# Patient Record
Sex: Female | Born: 1980 | Race: Black or African American | Hispanic: No | Marital: Married | Smoking: Never smoker
Health system: Southern US, Community
[De-identification: ages and names within clinical notes are randomized; demographics above are authoritative.]

## PROBLEM LIST (undated history)

## (undated) DIAGNOSIS — C801 Malignant (primary) neoplasm, unspecified: Secondary | ICD-10-CM

## (undated) DIAGNOSIS — M1990 Unspecified osteoarthritis, unspecified site: Secondary | ICD-10-CM

## (undated) HISTORY — PX: TUMOR EXCISION: SHX421

---

## 2011-04-15 DIAGNOSIS — B372 Candidiasis of skin and nail: Secondary | ICD-10-CM | POA: Insufficient documentation

## 2011-04-15 DIAGNOSIS — E669 Obesity, unspecified: Secondary | ICD-10-CM | POA: Insufficient documentation

## 2011-04-15 DIAGNOSIS — Z789 Other specified health status: Secondary | ICD-10-CM | POA: Insufficient documentation

## 2011-05-17 DIAGNOSIS — IMO0001 Reserved for inherently not codable concepts without codable children: Secondary | ICD-10-CM | POA: Insufficient documentation

## 2011-05-20 DIAGNOSIS — E559 Vitamin D deficiency, unspecified: Secondary | ICD-10-CM | POA: Insufficient documentation

## 2011-05-20 DIAGNOSIS — E039 Hypothyroidism, unspecified: Secondary | ICD-10-CM | POA: Insufficient documentation

## 2011-07-07 DIAGNOSIS — E282 Polycystic ovarian syndrome: Secondary | ICD-10-CM | POA: Insufficient documentation

## 2011-10-06 DIAGNOSIS — C801 Malignant (primary) neoplasm, unspecified: Secondary | ICD-10-CM | POA: Insufficient documentation

## 2011-10-27 DIAGNOSIS — R32 Unspecified urinary incontinence: Secondary | ICD-10-CM | POA: Insufficient documentation

## 2013-05-16 DIAGNOSIS — L910 Hypertrophic scar: Secondary | ICD-10-CM | POA: Insufficient documentation

## 2015-03-16 DIAGNOSIS — M542 Cervicalgia: Secondary | ICD-10-CM | POA: Insufficient documentation

## 2015-03-16 DIAGNOSIS — M545 Low back pain, unspecified: Secondary | ICD-10-CM | POA: Insufficient documentation

## 2015-03-16 DIAGNOSIS — M79603 Pain in arm, unspecified: Secondary | ICD-10-CM | POA: Insufficient documentation

## 2015-05-01 DIAGNOSIS — N979 Female infertility, unspecified: Secondary | ICD-10-CM | POA: Insufficient documentation

## 2016-01-05 DIAGNOSIS — M47816 Spondylosis without myelopathy or radiculopathy, lumbar region: Secondary | ICD-10-CM | POA: Insufficient documentation

## 2016-01-05 DIAGNOSIS — G8929 Other chronic pain: Secondary | ICD-10-CM | POA: Insufficient documentation

## 2016-01-05 DIAGNOSIS — M25561 Pain in right knee: Secondary | ICD-10-CM | POA: Insufficient documentation

## 2016-06-15 DIAGNOSIS — M51369 Other intervertebral disc degeneration, lumbar region without mention of lumbar back pain or lower extremity pain: Secondary | ICD-10-CM | POA: Insufficient documentation

## 2016-06-15 DIAGNOSIS — M5136 Other intervertebral disc degeneration, lumbar region: Secondary | ICD-10-CM | POA: Insufficient documentation

## 2017-04-23 DIAGNOSIS — N970 Female infertility associated with anovulation: Secondary | ICD-10-CM | POA: Insufficient documentation

## 2017-05-12 ENCOUNTER — Other Ambulatory Visit: Payer: Self-pay | Admitting: Obstetrics and Gynecology

## 2017-05-12 DIAGNOSIS — Z3149 Encounter for other procreative investigation and testing: Secondary | ICD-10-CM

## 2017-11-03 ENCOUNTER — Emergency Department
Admission: EM | Admit: 2017-11-03 | Discharge: 2017-11-04 | Disposition: A | Discharge status: Home / Self Care | Payer: BC Managed Care – PPO | Attending: Emergency Medicine | Admitting: Emergency Medicine

## 2017-11-03 DIAGNOSIS — Z859 Personal history of malignant neoplasm, unspecified: Secondary | ICD-10-CM | POA: Insufficient documentation

## 2017-11-03 DIAGNOSIS — T783XXA Angioneurotic edema, initial encounter: Secondary | ICD-10-CM

## 2017-11-03 DIAGNOSIS — Z91010 Allergy to peanuts: Secondary | ICD-10-CM | POA: Diagnosis not present

## 2017-11-03 DIAGNOSIS — R220 Localized swelling, mass and lump, head: Secondary | ICD-10-CM | POA: Diagnosis present

## 2017-11-03 HISTORY — DX: Unspecified osteoarthritis, unspecified site: M19.90

## 2017-11-03 HISTORY — DX: Malignant (primary) neoplasm, unspecified: C80.1

## 2017-11-03 MED ORDER — FAMOTIDINE IN NACL 20-0.9 MG/50ML-% IV SOLN
20.0000 mg | Freq: Once | INTRAVENOUS | Status: AC
  Administered 2017-11-03: 20 mg via INTRAVENOUS
  Filled 2017-11-03: qty 50

## 2017-11-03 MED ORDER — METHYLPREDNISOLONE SODIUM SUCC 125 MG IJ SOLR
125.0000 mg | Freq: Once | INTRAMUSCULAR | Status: AC
  Administered 2017-11-03: 125 mg via INTRAVENOUS
  Filled 2017-11-03: qty 2

## 2017-11-03 MED ORDER — DIPHENHYDRAMINE HCL 50 MG/ML IJ SOLN
25.0000 mg | Freq: Once | INTRAMUSCULAR | Status: AC
Start: 2017-11-03 — End: 2017-11-03
  Administered 2017-11-03: 25 mg via INTRAVENOUS
  Filled 2017-11-03: qty 1

## 2017-11-03 NOTE — ED Notes (Signed)
Patient reports she took 1 benadryl approximately 2045.

## 2017-11-03 NOTE — ED Notes (Signed)
Pt talking in full and complete sentences with no difficulty at this time.

## 2017-11-03 NOTE — ED Notes (Signed)
Coffee provided to pt's spouse.

## 2017-11-03 NOTE — ED Triage Notes (Signed)
Patient c/o oral swelling beginning yesterday. Patient reports that lip swelling began yesterday, tongue and throat swelling began today.   Patient speaking in clear sentences, no distress noted. Patient able to maintain control of saliva.  Patient denies exposure to any known allergen.

## 2017-11-04 MED ORDER — FAMOTIDINE 40 MG PO TABS: 40.0000 mg | ORAL_TABLET | Freq: Every evening | ORAL | 0 refills | Status: DC

## 2017-11-04 MED ORDER — DIPHENHYDRAMINE HCL 25 MG PO TABS: 25.0000 mg | ORAL_TABLET | Freq: Four times a day (QID) | ORAL | 0 refills | Status: DC | PRN

## 2017-11-04 MED ORDER — PREDNISONE 20 MG PO TABS: 60.0000 mg | ORAL_TABLET | Freq: Every day | ORAL | 0 refills | Status: DC

## 2017-11-04 MED ORDER — EPINEPHRINE 0.3 MG/0.3ML IJ SOAJ: 0.3000 mg | Freq: Once | INTRAMUSCULAR | 0 refills | Status: AC

## 2017-11-04 NOTE — ED Notes (Signed)
Pt resting in no acute distress. Pt states tongue feels improved.

## 2017-11-04 NOTE — Discharge Instructions (Addendum)
Please use your EpiPen should you develop any swelling in your tongue or throat or shortness of breath.  Please return with any worsening symptoms or any other concerns.

## 2017-11-04 NOTE — ED Provider Notes (Signed)
[facility] Emergency Department Provider Note   ____________________________________________   First MD Initiated Contact with Patient 11/03/17 2310     (approximate)  I have reviewed the triage vital signs and the nursing notes.   HISTORY  Chief Complaint Oral Swelling    HPI Stephanie Lane is a 37 y.o. female who comes into the hospital today with swelling to her lips and tongue and throat.  She states that she noticed some swelling to her lips yesterday when she woke up from a nap.  She did not think much of it although she did have some bruises in the middle of her lip.  She reports that this morning when she woke up it seemed worse.  She took a Benadryl.  Tonight when she was eating dinner the tongue and her back of her throat felt tingly.  She developed some difficulty swallowing.  She took another Benadryl and decided to come in and get checked out.  She denies any shortness of breath, nausea, vomiting, new foods.  The patient has had some veggie burgers and veggie hotdogs.  She had a smoothie this morning with almonds but nothing else that would have potentially caused her reaction.  The patient is here today for evaluation of her symptoms.   Past Medical History:  Diagnosis Date  . Arthritis   . Cancer ([place])     There are no active problems to display for this patient.   Past Surgical History:  Procedure Laterality Date  . TUMOR EXCISION      Prior to Admission medications   Medication Sig Start Date End Date Taking? Authorizing Provider  hydrocortisone valerate cream (WESTCORT) 0.2 % Apply 1 application topically 2 (two) times daily as needed (itching).  01/23/17  Yes [provider]  levothyroxine (SYNTHROID, LEVOTHROID) 100 MCG tablet Take 100 mcg by mouth daily before breakfast. 10/13/17  Yes [provider]  triamcinolone cream (KENALOG) 0.1 % Apply 1 application topically 2 (two) times daily as needed for rash. for  legs, not for face, breasts or skin folds, not more than 2 weeks in a month 10/03/17  Yes [provider]  diphenhydrAMINE (BENADRYL ALLERGY) 25 MG tablet Take 1 tablet (25 mg total) by mouth every 6 (six) hours as needed. 11/04/17   Loney Hering, MD  EPINEPHrine (EPIPEN 2-PAK) 0.3 mg/0.3 mL IJ SOAJ injection Inject 0.3 mLs (0.3 mg total) into the muscle once for 1 dose. 11/04/17 11/04/17  Loney Hering, MD  famotidine (PEPCID) 40 MG tablet Take 1 tablet (40 mg total) by mouth every evening. 11/04/17 11/04/18  Loney Hering, MD  predniSONE (DELTASONE) 20 MG tablet Take 3 tablets (60 mg total) by mouth daily. 11/04/17   Loney Hering, MD    Allergies Peanut-containing drug products  No family history on file.  Social History Social History   Tobacco Use  . Smoking status: Never Smoker  . Smokeless tobacco: Never Used  Substance Use Topics  . Alcohol use: Not Currently  . Drug use: Not on file    Review of Systems  Constitutional: No fever/chills Eyes: No visual changes. ENT: Lip, tongue and throat swelling and tingling. Cardiovascular: Denies chest pain. Respiratory: Denies shortness of breath. Gastrointestinal: No abdominal pain.   Genitourinary: Negative for dysuria. Musculoskeletal: Negative for back pain. Skin: Negative for rash. Neurological: Negative for headaches   ____________________________________________   PHYSICAL EXAM:  VITAL SIGNS: ED Triage Vitals  Enc Vitals Group     BP  11/03/17 2110 120/72     Pulse Rate 11/03/17 2110 80     Resp 11/03/17 2110 18     Temp 11/03/17 2110 98.7 F (37.1 C)     Temp Source 11/03/17 2110 Oral     SpO2 11/03/17 2110 100 %     Weight 11/03/17 2111 290 lb (131.5 kg)     Height 11/03/17 2111 5\' 1"  (1.549 m)     Head Circumference --      Peak Flow --      Pain Score 11/03/17 2111 9     Pain Loc --      Pain Edu? --      Excl. in [place]? --     Constitutional: Alert and oriented. Well appearing  and in mild distress. Eyes: Conjunctivae are normal. PERRL. EOMI. Head: Atraumatic. Nose: No congestion/rhinnorhea. Mouth/Throat: Mucous membranes are moist.  Oropharynx non-erythematous.  There is some mild swelling to the patient's lips and some mild swelling to her tongue.  She is not drooling and she is tolerating her secretions well. Cardiovascular: Normal rate, regular rhythm. Grossly normal heart sounds.  Good peripheral circulation. Respiratory: Normal respiratory effort.  No retractions. Lungs CTAB. Gastrointestinal: Soft and nontender. No distention.  Positive bowel sounds Musculoskeletal: No lower extremity tenderness nor edema. . Neurologic:  Normal speech and language.  Skin:  Skin is warm, dry and intact.  Psychiatric: Mood and affect are normal.   ____________________________________________   LABS (all labs ordered are listed, but only abnormal results are displayed)  Labs Reviewed - No data to display ____________________________________________  EKG  none ____________________________________________  RADIOLOGY  ED MD interpretation:  none  Official radiology report(s): No results found.  ____________________________________________   PROCEDURES  Procedure(s) performed: None  Procedures  Critical Care performed: No  ____________________________________________   INITIAL IMPRESSION / ASSESSMENT AND PLAN / ED COURSE  As part of my medical decision making, I reviewed the following data within the electronic MEDICAL RECORD NUMBER Notes from prior ED visits and [place] Controlled Substance Database   This is a 37 year old female who comes into the hospital today with some lip swelling as well as tongue swelling and throat swelling.  The patient was concerned about allergic reaction.  She took 2 doses of Benadryl but came into the hospital.  The patient had some minimal swelling but when she arrived I did give her some Solu-Medrol as well as Pepcid and some more  Benadryl.  I did not give the patient an EpiPen as she did not have any drooling or any severe distress.  She also is not having any shortness of breath.  We monitored the patient for multiple hours in the emergency department.  She states that eventually her throat swelling and tingling as well as her tongue swelling improved.  The patient does had some numbness at the tip of her tongue.  The patient will be discharged home with some steroids.  She should follow-up with an allergist for further evaluation of her allergic symptoms.      ____________________________________________   FINAL CLINICAL IMPRESSION(S) / ED DIAGNOSES  Final diagnoses:  Angioedema, initial encounter     ED Discharge Orders        Ordered    predniSONE (DELTASONE) 20 MG tablet  Daily     11/04/17 0212    famotidine (PEPCID) 40 MG tablet  Every evening     11/04/17 0212    diphenhydrAMINE (BENADRYL ALLERGY) 25 MG tablet  Every 6 hours PRN  11/04/17 0212    EPINEPHrine (EPIPEN 2-PAK) 0.3 mg/0.3 mL IJ SOAJ injection   Once     11/04/17 8006       Note:  This document was prepared using Dragon voice recognition software and may include unintentional dictation errors.    Loney Hering, MD 11/04/17 [id number]

## 2018-04-03 ENCOUNTER — Ambulatory Visit: Payer: BC Managed Care – PPO | Admitting: Internal Medicine

## 2018-05-01 ENCOUNTER — Other Ambulatory Visit: Payer: Self-pay | Admitting: Obstetrics and Gynecology

## 2018-05-18 ENCOUNTER — Ambulatory Visit
Admission: RE | Admit: 2018-05-18 | Discharge: 2018-05-18 | Disposition: A | Discharge status: Home / Self Care | Payer: BC Managed Care – PPO | Source: Ambulatory Visit | Attending: Obstetrics and Gynecology | Admitting: Obstetrics and Gynecology

## 2018-05-18 DIAGNOSIS — Z3149 Encounter for other procreative investigation and testing: Secondary | ICD-10-CM

## 2018-05-18 DIAGNOSIS — N970 Female infertility associated with anovulation: Secondary | ICD-10-CM | POA: Diagnosis present

## 2018-05-18 MED ORDER — IOPAMIDOL (ISOVUE-370) INJECTION 76%
5.0000 mL | Freq: Once | INTRAVENOUS | Status: AC | PRN
  Administered 2018-05-18: 5 mL

## 2018-05-18 NOTE — Progress Notes (Signed)
Patient ID: Stephanie Lane, female   DOB: [birth year], 38 y.o.   MRN: [id number]  [id number] y.o. 05/18/18  Benjaman Kindler, MD  Hysterosalpingogram Procedure Note  Date of procedure: 05/18/2018   Pre-operative Diagnosis: Infertility  Post-operative Diagnosis: same, no tubal blockage  Procedure: Hysterosalpingogram  Surgeon: Angelina Pih, MD  Assistant(s):  Radiology assistant. The radiologist present for today read the imaging and agreed with findings below.  Anesthesia: None  Estimated Blood Loss:  None         Complications:  None; patient tolerated the procedure well.         Disposition: To home         Condition: stable  Findings: Bilateral fill and spill of the tubes and a normal endometrial contour was noted. Right tube was sluggish but spill did result.  Procedure Details  HSG procedure discussed with the patient.  Risks, complications, alternatives have been reviewed with her and she agrees to proceed.   The patient presented to the radiology lab and was identified as the correct patient and the procedure verified as an HSG. A verbal Time Out was held with all team members present and in agreement.  Speculum was inserted in to the vagina and the cervix visualized.  The cervix was cleaned with betadine solution. The HSG catheter was inserted and the balloon insufflated with approximately 1.5 ml of air.  Patient was then repositioned for fluoroscopy.  A total of 6 ml of contrast was used for the procedure. The patient tolerated the procedure well, no complications.   Bilateral fill and spill of the tubes and a normal endometrial contour was noted.  Results were reviewed with the patient at the time of the procedure. She verbalized understanding.   Benjaman Kindler, MD 05/18/2018

## 2020-03-23 ENCOUNTER — Encounter: Payer: Self-pay | Admitting: Neurology

## 2020-04-13 NOTE — Progress Notes (Signed)
Cardiology Office Note:    Date:  04/17/2020   ID:  Stephanie Lane, DOB [birth year], MRN [id number]  PCP:  Glendon Axe, MD  Cardiologist:  No primary care provider on file.  Electrophysiologist:  None   Referring MD: Ephriam Jenkins, PA   Chief Complaint  Patient presents with  . Palpitations    History of Present Illness:    Stephanie Lane is a 40 y.o. female with a hx of hypothyroidism who is referred by Benjiman Core, PA for evaluation of cardiovascular risk assessment.  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  Following this episode, developed numbness in her face that persisted for days.  She has an appointment coming up with neurology for evaluation.  Is also been having episodes of lightheadedness and palpitations since that time.  No syncopal episodes but has felt significant lightheadedness.  Having episode of palpitations were feels like her heart is racing.  Occurring multiple times per day, lasts for 3 to 5 minutes.  She denies any chest pain or shortness of breath.  She works out by doing 15 minutes of cardio per day and 30 minutes on the weekend.  She has lost over 100 pounds in the last 2 years.  No smoking history.  Family history includes mother had MI in 50s and maternal grandmother died of MI at age 60.   Past Medical History:  Diagnosis Date  . Arthritis   . Cancer [facility])     Past Surgical History:  Procedure Laterality Date  . TUMOR EXCISION      Current Medications: Current Meds  Medication Sig  . Chaste Tree (VITEX EXTRACT PO) Take by mouth.  Leone Haven, Turnera diffusa, (DAMIANA LEAF PO) Take by mouth.  . diphenhydrAMINE (BENADRYL ALLERGY) 25 MG tablet Take 1 tablet (25 mg total) by mouth every 6 (six) hours as needed.  Marland Kitchen EPINEPHrine (EPIPEN 2-PAK) 0.3 mg/0.3 mL IJ SOAJ injection Inject 0.3 mLs (0.3 mg total) into the muscle once for 1 dose.  . hydrocortisone valerate cream  (WESTCORT) 0.2 % Apply 1 application topically 2 (two) times daily as needed (itching).   Marland Kitchen levothyroxine (SYNTHROID, LEVOTHROID) 100 MCG tablet Take 100 mcg by mouth daily before breakfast.  . niacin 100 MG tablet Take 100 mg by mouth at bedtime.  Marland Kitchen PANTOTHENIC ACID PO Take by mouth.  . predniSONE (DELTASONE) 20 MG tablet Take 3 tablets (60 mg total) by mouth daily.  . Red Yeast Rice Extract (RED YEAST RICE PO) Take by mouth.  . triamcinolone cream (KENALOG) 0.1 % Apply 1 application topically 2 (two) times daily as needed for rash. for legs, not for face, breasts or skin folds, not more than 2 weeks in a month     Allergies:   Metformin, Other, Stevioside, Amoxicillin, Erythromycin base, Peanut-containing drug products, and Hydrocodone   Social History   Socioeconomic History  . Marital status: Married    Spouse name: Not on file  . Number of children: Not on file  . Years of education: Not on file  . Highest education level: Not on file  Occupational History  . Not on file  Tobacco Use  . Smoking status: Never Smoker  . Smokeless tobacco: Never Used  Substance and Sexual Activity  . Alcohol use: Not Currently  . Drug use: Not on file  . Sexual activity: Not on file  Other Topics Concern  . Not on file  Social History Narrative  .  Not on file   Social Determinants of Health   Financial Resource Strain: Not on file  Food Insecurity: Not on file  Transportation Needs: Not on file  Physical Activity: Not on file  Stress: Not on file  Social Connections: Not on file     Family History: The patient's family history is not on file.  ROS:   Please see the history of present illness.     All other systems reviewed and are negative.  EKGs/Labs/Other Studies Reviewed:    The following studies were reviewed today:   EKG:  EKG is  ordered today.  The ekg ordered today demonstrates normal sinus rhythm, rate 62, no ST abnormality  Recent Labs: No results found for  requested labs within last 8760 hours.  Recent Lipid Panel No results found for: CHOL, TRIG, HDL, CHOLHDL, VLDL, LDLCALC, LDLDIRECT  Physical Exam:    VS:  BP 124/78   Pulse 62   Ht 5\' 1"  (1.549 m)   Wt 194 lb 9.6 oz (88.3 kg)   BMI 36.77 kg/m     Wt Readings from Last 3 Encounters:  04/17/20 194 lb 9.6 oz (88.3 kg)  11/03/17 290 lb (131.5 kg)     GEN: Well nourished, well developed in no acute distress HEENT: Normal NECK: No JVD; No carotid bruits LYMPHATICS: No lymphadenopathy CARDIAC: RRR, no murmurs, rubs, gallops RESPIRATORY:  Clear to auscultation without rales, wheezing or rhonchi  ABDOMEN: Soft, non-tender, non-distended MUSCULOSKELETAL:  No edema; No deformity  SKIN: Warm and dry NEUROLOGIC:  Alert and oriented x 3 PSYCHIATRIC:  Normal affect   ASSESSMENT:    1. Palpitations   2. Family history of early CAD   3. Lightheadedness   4. Hyperlipidemia, unspecified hyperlipidemia type    PLAN:     Palpitations: Will check Zio patch x3 days to evaluate for arrhythmia.  If unremarkable, suspect episodes likely related to anxiety/panic attacks  Lightheadedness: Will check echocardiogram to rule out structural heart disease  Hyperlipidemia: LDL 167 on 11/28/2019.  Does not meet indication for statin at this time but given her family history, will check calcium score for further risk ratification  RTC in 3 months   Medication Adjustments/Labs and Tests Ordered: Current medicines are reviewed at length with the patient today.  Concerns regarding medicines are outlined above.  Orders Placed This Encounter  Procedures  . CT CARDIAC SCORING (SELF PAY ONLY)  . LONG TERM MONITOR (3-14 DAYS)  . EKG 12-Lead  . ECHOCARDIOGRAM COMPLETE   No orders of the defined types were placed in this encounter.   Patient Instructions  Medication Instructions:  Your physician recommends that you continue on your current medications as directed. Please refer to the Current  Medication list given to you today.  Testing/Procedures: Your physician has requested that you have an echocardiogram. Echocardiography is a painless test that uses sound waves to create images of your heart. It provides your doctor with information about the size and shape of your heart and how well your heart's chambers and valves are working. This procedure takes approximately one hour. There are no restrictions for this procedure.  This will be done at our [facility] location:  [address]  CT coronary calcium score. This test is done at [address]. This is $99 out of pocket.   Coronary CalciumScan A coronary calcium scan is an imaging test used to look for deposits of calcium and other fatty materials (plaques) in  the inner lining of the blood vessels of the heart (coronary arteries). These deposits of calcium and plaques can partly clog and narrow the coronary arteries without producing any symptoms or warning signs. This puts a person at risk for a heart attack. This test can detect these deposits before symptoms develop. Tell a health care provider about:  Any allergies you have.  All medicines you are taking, including vitamins, herbs, eye drops, creams, and over-the-counter medicines.  Any problems you or family members have had with anesthetic medicines.  Any blood disorders you have.  Any surgeries you have had.  Any medical conditions you have.  Whether you are pregnant or may be pregnant. What are the risks? Generally, this is a safe procedure. However, problems may occur, including:  Harm to a pregnant woman and her unborn baby. This test involves the use of radiation. Radiation exposure can be dangerous to a pregnant woman and her unborn baby. If you are pregnant, you generally should not have this procedure done.  Slight increase in the risk of cancer. This is because of the radiation involved in the test. What happens  before the procedure? No preparation is needed for this procedure. What happens during the procedure?  You will undress and remove any jewelry around your neck or chest.  You will put on a hospital gown.  Sticky electrodes will be placed on your chest. The electrodes will be connected to an electrocardiogram (ECG) machine to record a tracing of the electrical activity of your heart.  A CT scanner will take pictures of your heart. During this time, you will be asked to lie still and hold your breath for 2-3 seconds while a picture of your heart is being taken. The procedure may vary among health care providers and hospitals. What happens after the procedure?  You can get dressed.  You can return to your normal activities.  It is up to you to get the results of your test. Ask your health care provider, or the department that is doing the test, when your results will be ready. Summary  A coronary calcium scan is an imaging test used to look for deposits of calcium and other fatty materials (plaques) in the inner lining of the blood vessels of the heart (coronary arteries).  Generally, this is a safe procedure. Tell your health care provider if you are pregnant or may be pregnant.  No preparation is needed for this procedure.  A CT scanner will take pictures of your heart.  You can return to your normal activities after the scan is done. This information is not intended to replace advice given to you by your health care provider. Make sure you discuss any questions you have with your health care provider. Document Released: 09/24/2007 Document Revised: 02/15/2016 Document Reviewed: 02/15/2016 Elsevier Interactive Patient Education  2017 [employer] Monitor Instructions   Your physician has requested you wear your ZIO patch monitor 3 days.   This is a single patch monitor.  [employer] supplies one patch monitor per enrollment.  Additional stickers are not  available.   Please do not apply patch if you will be having a Nuclear Stress Test, Echocardiogram, Cardiac CT, MRI, or Chest Xray during the time frame you would be wearing the monitor. The patch cannot be worn during these tests.  You cannot remove and re-apply the ZIO XT patch monitor.   Your ZIO patch monitor will be sent USPS Priority mail from  [employer] directly to your home address. The monitor may also be mailed to a PO BOX if home delivery is not available.   It may take 3-5 days to receive your monitor after you have been enrolled.   Once you have received you monitor, please review enclosed instructions.  Your monitor has already been registered assigning a specific monitor serial # to you.   Applying the monitor   Shave hair from upper left chest.   Hold abrader disc by orange tab.  Rub abrader in 40 strokes over left upper chest as indicated in your monitor instructions.   Clean area with 4 enclosed alcohol pads .  Use all pads to assure are is cleaned thoroughly.  Let dry.   Apply patch as indicated in monitor instructions.  Patch will be place under collarbone on left side of chest with arrow pointing upward.   Rub patch adhesive wings for 2 minutes.Remove white label marked "1".  Remove white label marked "2".  Rub patch adhesive wings for 2 additional minutes.   While looking in a mirror, press and release button in center of patch.  A small green light will flash 3-4 times .  This will be your only indicator the monitor has been turned on.     Do not shower for the first 24 hours.  You may shower after the first 24 hours.   Press button if you feel a symptom. You will hear a small click.  Record Date, Time and Symptom in the Patient Log Book.   When you are ready to remove patch, follow instructions on last 2 pages of Patient Log Book.  Stick patch monitor onto last page of Patient Log Book.   Place Patient Log Book in [place] box.  Use locking tab on box and  tape box closed securely.  The [employer] and [employer] has [employer] on it.  Please place in mailbox as soon as possible.  Your physician should have your test results approximately 7 days after the monitor has been mailed back to [facility].   Call [facility] Customer Care at [phone] if you have questions regarding your ZIO XT patch monitor.  Call them immediately if you see an orange light blinking on your monitor.   If your monitor falls off in less than 4 days contact our Monitor department at [phone].  If your monitor becomes loose or falls off after 4 days call [employer] at [phone] for suggestions on securing your monitor.    Follow-Up: At [facility], you and your health needs are our priority.  As part of our continuing mission to provide you with exceptional heart care, we have created designated Provider Care Teams.  These Care Teams include your primary Cardiologist (physician) and Advanced Practice Providers (APPs -  Physician Assistants and Nurse Practitioners) who all work together to provide you with the care you need, when you need it.  We recommend signing up for the patient portal called "MyChart".  Sign up information is provided on this After Visit Summary.  MyChart is used to connect with patients for Virtual Visits (Telemedicine).  Patients are able to view lab/test results, encounter notes, upcoming appointments, etc.  Non-urgent messages can be sent to your provider as well.   To learn more about what you can do with MyChart, go to [email or web address].    Your next appointment:   3 month(s)  The format for your next appointment:   In Person  Provider:   Cristal Deer  Gardiner Rhyme, MD         Signed, Donato Heinz, MD  04/17/2020 6:09 PM    [place]

## 2020-04-17 ENCOUNTER — Other Ambulatory Visit: Payer: Self-pay

## 2020-04-17 ENCOUNTER — Encounter: Payer: Self-pay | Admitting: Cardiology

## 2020-04-17 ENCOUNTER — Encounter: Payer: Self-pay | Admitting: Radiology

## 2020-04-17 ENCOUNTER — Ambulatory Visit (INDEPENDENT_AMBULATORY_CARE_PROVIDER_SITE_OTHER): Payer: BC Managed Care – PPO

## 2020-04-17 ENCOUNTER — Ambulatory Visit (INDEPENDENT_AMBULATORY_CARE_PROVIDER_SITE_OTHER): Payer: Self-pay | Admitting: Cardiology

## 2020-04-17 VITALS — BP 124/78 | HR 62 | Ht 61.0 in | Wt 194.6 lb

## 2020-04-17 DIAGNOSIS — R42 Dizziness and giddiness: Secondary | ICD-10-CM

## 2020-04-17 DIAGNOSIS — Z8249 Family history of ischemic heart disease and other diseases of the circulatory system: Secondary | ICD-10-CM

## 2020-04-17 DIAGNOSIS — R002 Palpitations: Secondary | ICD-10-CM

## 2020-04-17 DIAGNOSIS — E785 Hyperlipidemia, unspecified: Secondary | ICD-10-CM

## 2020-04-17 NOTE — Patient Instructions (Signed)
Medication Instructions:  Your physician recommends that you continue on your current medications as directed. Please refer to the Current Medication list given to you today.  Testing/Procedures: Your physician has requested that you have an echocardiogram. Echocardiography is a painless test that uses sound waves to create images of your heart. It provides your doctor with information about the size and shape of your heart and how well your heart's chambers and valves are working. This procedure takes approximately one hour. There are no restrictions for this procedure.  This will be done at our [facility] location:  [address]  CT coronary calcium score. This test is done at [address]. This is $99 out of pocket.   Coronary CalciumScan A coronary calcium scan is an imaging test used to look for deposits of calcium and other fatty materials (plaques) in the inner lining of the blood vessels of the heart (coronary arteries). These deposits of calcium and plaques can partly clog and narrow the coronary arteries without producing any symptoms or warning signs. This puts a person at risk for a heart attack. This test can detect these deposits before symptoms develop. Tell a health care provider about:  Any allergies you have.  All medicines you are taking, including vitamins, herbs, eye drops, creams, and over-the-counter medicines.  Any problems you or family members have had with anesthetic medicines.  Any blood disorders you have.  Any surgeries you have had.  Any medical conditions you have.  Whether you are pregnant or may be pregnant. What are the risks? Generally, this is a safe procedure. However, problems may occur, including:  Harm to a pregnant woman and her unborn baby. This test involves the use of radiation. Radiation exposure can be dangerous to a pregnant woman and her unborn baby. If you are pregnant, you generally should not  have this procedure done.  Slight increase in the risk of cancer. This is because of the radiation involved in the test. What happens before the procedure? No preparation is needed for this procedure. What happens during the procedure?  You will undress and remove any jewelry around your neck or chest.  You will put on a hospital gown.  Sticky electrodes will be placed on your chest. The electrodes will be connected to an electrocardiogram (ECG) machine to record a tracing of the electrical activity of your heart.  A CT scanner will take pictures of your heart. During this time, you will be asked to lie still and hold your breath for 2-3 seconds while a picture of your heart is being taken. The procedure may vary among health care providers and hospitals. What happens after the procedure?  You can get dressed.  You can return to your normal activities.  It is up to you to get the results of your test. Ask your health care provider, or the department that is doing the test, when your results will be ready. Summary  A coronary calcium scan is an imaging test used to look for deposits of calcium and other fatty materials (plaques) in the inner lining of the blood vessels of the heart (coronary arteries).  Generally, this is a safe procedure. Tell your health care provider if you are pregnant or may be pregnant.  No preparation is needed for this procedure.  A CT scanner will take pictures of your heart.  You can return to your normal activities after the scan is done. This information is not  intended to replace advice given to you by your health care provider. Make sure you discuss any questions you have with your health care provider. Document Released: 09/24/2007 Document Revised: 02/15/2016 Document Reviewed: 02/15/2016 Elsevier Interactive Patient Education  2017 [place] Term Monitor Instructions   Your physician has requested you wear your ZIO patch  monitor 3 days.   This is a single patch monitor.  [employer] supplies one patch monitor per enrollment.  Additional stickers are not available.   Please do not apply patch if you will be having a Nuclear Stress Test, Echocardiogram, Cardiac CT, MRI, or Chest Xray during the time frame you would be wearing the monitor. The patch cannot be worn during these tests.  You cannot remove and re-apply the ZIO XT patch monitor.   Your ZIO patch monitor will be sent USPS Priority mail from [facility] directly to your home address. The monitor may also be mailed to a PO BOX if home delivery is not available.   It may take 3-5 days to receive your monitor after you have been enrolled.   Once you have received you monitor, please review enclosed instructions.  Your monitor has already been registered assigning a specific monitor serial # to you.   Applying the monitor   Shave hair from upper left chest.   Hold abrader disc by orange tab.  Rub abrader in 40 strokes over left upper chest as indicated in your monitor instructions.   Clean area with 4 enclosed alcohol pads .  Use all pads to assure are is cleaned thoroughly.  Let dry.   Apply patch as indicated in monitor instructions.  Patch will be place under collarbone on left side of chest with arrow pointing upward.   Rub patch adhesive wings for 2 minutes.Remove white label marked "1".  Remove white label marked "2".  Rub patch adhesive wings for 2 additional minutes.   While looking in a mirror, press and release button in center of patch.  A small green light will flash 3-4 times .  This will be your only indicator the monitor has been turned on.     Do not shower for the first 24 hours.  You may shower after the first 24 hours.   Press button if you feel a symptom. You will hear a small click.  Record Date, Time and Symptom in the Patient Log Book.   When you are ready to remove patch, follow instructions on last 2 pages of Patient Log  Book.  Stick patch monitor onto last page of Patient Log Book.   Place Patient Log Book in [place] box.  Use locking tab on box and tape box closed securely.  The [employer] has [employer] on it.  Please place in mailbox as soon as possible.  Your physician should have your test results approximately 7 days after the monitor has been mailed back to [facility].   Call [place] at [phone] if you have questions regarding your ZIO XT patch monitor.  Call them immediately if you see an orange light blinking on your monitor.   If your monitor falls off in less than 4 days contact our Monitor department at [phone].  If your monitor becomes loose or falls off after 4 days call [employer] at [phone] for suggestions on securing your monitor.    Follow-Up: At [facility], you and your health needs are our priority.  As part of  our continuing mission to provide you with exceptional heart care, we have created designated Provider Care Teams.  These Care Teams include your primary Cardiologist (physician) and Advanced Practice Providers (APPs -  Physician Assistants and Nurse Practitioners) who all work together to provide you with the care you need, when you need it.  We recommend signing up for the patient portal called "MyChart".  Sign up information is provided on this After Visit Summary.  MyChart is used to connect with patients for Virtual Visits (Telemedicine).  Patients are able to view lab/test results, encounter notes, upcoming appointments, etc.  Non-urgent messages can be sent to your provider as well.   To learn more about what you can do with MyChart, go to [email or web address].    Your next appointment:   3 month(s)  The format for your next appointment:   In Person  Provider:   Oswaldo Milian, MD

## 2020-04-17 NOTE — Progress Notes (Signed)
Enrolled patient for a 3 day Zio XT Monitor to be mailed to patients home.  °

## 2020-04-24 ENCOUNTER — Ambulatory Visit (INDEPENDENT_AMBULATORY_CARE_PROVIDER_SITE_OTHER)
Admission: RE | Admit: 2020-04-24 | Discharge: 2020-04-24 | Disposition: A | Discharge status: Home / Self Care | Payer: Self-pay | Source: Ambulatory Visit | Attending: Cardiology | Admitting: Cardiology

## 2020-04-24 ENCOUNTER — Other Ambulatory Visit: Payer: Self-pay

## 2020-04-24 DIAGNOSIS — Z8249 Family history of ischemic heart disease and other diseases of the circulatory system: Secondary | ICD-10-CM

## 2020-05-06 DIAGNOSIS — R002 Palpitations: Secondary | ICD-10-CM

## 2020-05-12 ENCOUNTER — Other Ambulatory Visit: Payer: Self-pay

## 2020-05-12 ENCOUNTER — Ambulatory Visit (HOSPITAL_COMMUNITY): Payer: BC Managed Care – PPO | Attending: Internal Medicine

## 2020-05-12 DIAGNOSIS — R002 Palpitations: Secondary | ICD-10-CM

## 2020-05-12 LAB — ECHOCARDIOGRAM COMPLETE
Area-P 1/2: 2.87 cm2
S' Lateral: 2.8 cm

## 2020-05-21 ENCOUNTER — Encounter: Payer: Self-pay | Admitting: Neurology

## 2020-05-21 ENCOUNTER — Ambulatory Visit: Payer: BC Managed Care – PPO | Admitting: Neurology

## 2020-05-21 ENCOUNTER — Other Ambulatory Visit: Payer: Self-pay

## 2020-05-21 VITALS — BP 118/79 | HR 109 | Resp 18 | Ht 61.0 in | Wt 188.0 lb

## 2020-05-21 DIAGNOSIS — R202 Paresthesia of skin: Secondary | ICD-10-CM

## 2020-05-21 DIAGNOSIS — R200 Anesthesia of skin: Secondary | ICD-10-CM

## 2020-05-21 DIAGNOSIS — R251 Tremor, unspecified: Secondary | ICD-10-CM | POA: Diagnosis not present

## 2020-05-21 DIAGNOSIS — R2681 Unsteadiness on feet: Secondary | ICD-10-CM

## 2020-05-21 DIAGNOSIS — R42 Dizziness and giddiness: Secondary | ICD-10-CM

## 2020-05-21 DIAGNOSIS — R519 Headache, unspecified: Secondary | ICD-10-CM | POA: Diagnosis not present

## 2020-05-21 NOTE — Progress Notes (Signed)
NEUROLOGY CONSULTATION NOTE  Stephanie Lane MRN: [id number] DOB: [birth year]  Referring provider: Benjiman Core, PA Primary care provider: Maude Leriche, PA-C  Reason for consult:  dizziness   Subjective:  Stephanie Lane is a 40 year old right-handed female with hypothyroidism who presents for dizziness. History supplemented by referring provider's note.  On 02/25/2020 she was at work and received some distressing news.  She then developed a severe headache with palpitations, out of body sensation and left arm weakness.  She developed clenching of the of the left side of her face that lasted a week.  She has PTSD and this event aggravated her symptoms.  She then developed nightly panic attacks and elevated blood pressure.  After this event, she started experiencing nightly panic attacks and elevated blood pressure, hyperventilation; she felt nauseous for 3 weeks.  She was unable to process information and concentrate for 6 weeks.  She would have throbbing left occipital headaches off and on.    She noted diffuse weakness for 6 weeks.  Loss of appetite for 3 1/2 weeks.  Tremor with numbness in the index finger and thumb of her right hand.  No neck pain.  She feels off-balance when she walks but no falls.  She has been out of work due to these symptoms.  She has history of brief vertigo lasting a few seconds.  For palpitations, she was evaluated by cardiology.  Holter monitor normal.  Echocardiogram was normal except for trivial MVR.  Overall, symptoms have steadily been improving.    03/21/2020 LABS:  CBC with WBC 7.9, HGB 12.8, HCT 38.5, PLT 249; B12 891;   PAST MEDICAL HISTORY: Past Medical History:  Diagnosis Date  . Arthritis   . Cancer [facility])     PAST SURGICAL HISTORY: Past Surgical History:  Procedure Laterality Date  . TUMOR EXCISION      MEDICATIONS: Current Outpatient Medications on File Prior to Visit  Medication Sig Dispense Refill  . [place] Tree (VITEX EXTRACT  PO) Take by mouth.    Leone Haven, Turnera diffusa, (DAMIANA LEAF PO) Take by mouth.    . diphenhydrAMINE (BENADRYL ALLERGY) 25 MG tablet Take 1 tablet (25 mg total) by mouth every 6 (six) hours as needed. 30 tablet 0  . EPINEPHrine (EPIPEN 2-PAK) 0.3 mg/0.3 mL IJ SOAJ injection Inject 0.3 mLs (0.3 mg total) into the muscle once for 1 dose. 1 Device 0  . famotidine (PEPCID) 40 MG tablet Take 1 tablet (40 mg total) by mouth every evening. 7 tablet 0  . hydrocortisone valerate cream (WESTCORT) 0.2 % Apply 1 application topically 2 (two) times daily as needed (itching).     Marland Kitchen levothyroxine (SYNTHROID, LEVOTHROID) 100 MCG tablet Take 100 mcg by mouth daily before breakfast.  4  . niacin 100 MG tablet Take 100 mg by mouth at bedtime.    Marland Kitchen PANTOTHENIC ACID PO Take by mouth.    . predniSONE (DELTASONE) 20 MG tablet Take 3 tablets (60 mg total) by mouth daily. 12 tablet 0  . Red Yeast Rice Extract (RED YEAST RICE PO) Take by mouth.    . triamcinolone cream (KENALOG) 0.1 % Apply 1 application topically 2 (two) times daily as needed for rash. for legs, not for face, breasts or skin folds, not more than 2 weeks in a month  1   No current facility-administered medications on file prior to visit.    ALLERGIES: Allergies  Allergen Reactions  . Metformin Other (See Comments) and Diarrhea  . Other Swelling  and Itching  . Stevioside Dermatitis, Hives, Itching and Swelling  . Amoxicillin   . Erythromycin Base   . Peanut-Containing Drug Products Swelling  . Hydrocodone Other (See Comments) and Nausea Only    FAMILY HISTORY: No family history on file.  SOCIAL HISTORY: Social History   Socioeconomic History  . Marital status: Married    Spouse name: Not on file  . Number of children: Not on file  . Years of education: Not on file  . Highest education level: Not on file  Occupational History  . Not on file  Tobacco Use  . Smoking status: Never Smoker  . Smokeless tobacco: Never Used  Substance  and Sexual Activity  . Alcohol use: Not Currently  . Drug use: Not on file  . Sexual activity: Not on file  Other Topics Concern  . Not on file  Social History Narrative  . Not on file   Social Determinants of Health   Financial Resource Strain: Not on file  Food Insecurity: Not on file  Transportation Needs: Not on file  Physical Activity: Not on file  Stress: Not on file  Social Connections: Not on file  Intimate Partner Violence: Not on file    Objective:  Blood pressure 118/79, pulse (!) 109, resp. rate 18, height 5\' 1"  (1.549 m), weight 188 lb (85.3 kg), SpO2 100 %. General: No acute distress.  Patient appears well-groomed.   Head:  Normocephalic/atraumatic Eyes:  fundi examined but not visualized Neck: supple, no paraspinal tenderness, full range of motion Back: No paraspinal tenderness Heart: regular rate and rhythm Lungs: Clear to auscultation bilaterally. Vascular: No carotid bruits. Neurological Exam: Mental status: alert and oriented to person, place, and time, recent and remote memory intact, fund of knowledge intact, attention and concentration intact, speech fluent and not dysarthric, language intact. Cranial nerves: CN I: not tested CN II: pupils equal, round and reactive to light, visual fields intact CN III, IV, VI:  full range of motion, no nystagmus, no ptosis CN V: facial sensation intact. CN VII: upper and lower face symmetric CN VIII: hearing intact CN IX, X: gag intact, uvula midline CN XI: sternocleidomastoid and trapezius muscles intact CN XII: tongue midline Bulk & Tone: normal, no fasciculations. Motor:  muscle strength 5/5 throughout Sensation:  Pinprick, temperature and vibratory sensation intact. Deep Tendon Reflexes:  2+ throughout,  toes downgoing.   Finger to nose testing:  Without dysmetria.   Heel to shin:  Without dysmetria.   Gait:  Normal station and stride.  Romberg negative.  Assessment/Plan:   Multiple symptomatology  including: Headache Tremor Numbness and tingling of right hand  Weakness Panic attacks Vertigo  Will check MRI of brain with and without contrast.  Further recommendations pending results.   Thank you for allowing me to take part in the care of this patient.  Metta Clines, DO

## 2020-05-21 NOTE — Patient Instructions (Addendum)
1.  Will check MRI of brain with and without contrast. We have sent a referral to [facility] for your MRI and they will call you directly to schedule your appointment. They are located at [place]. If you need to contact them directly please call [phone].   2.  Further recommendations pending results.

## 2020-05-28 ENCOUNTER — Other Ambulatory Visit: Payer: Self-pay | Admitting: Neurology

## 2020-05-28 ENCOUNTER — Telehealth: Payer: Self-pay | Admitting: Neurology

## 2020-05-28 MED ORDER — DIAZEPAM 5 MG PO TABS: ORAL_TABLET | ORAL | 0 refills | Status: DC

## 2020-05-28 NOTE — Telephone Encounter (Signed)
Patient would like to know if there is anything Dr Tomi Likens could prescribe or recommend to help her get through her MRI? She states the last MRI she tried to do she ended up getting a panic attack during the imaging. Please call.

## 2020-05-28 NOTE — Telephone Encounter (Signed)
Left a detailed message for pt, Please take 30-60 minutes before MRI. Please have a driver.

## 2020-05-28 NOTE — Telephone Encounter (Signed)
Sent script for diazepam 5mg .  Must have driver to and from the MRI facility due to drowsiness.

## 2020-06-01 ENCOUNTER — Ambulatory Visit: Payer: BC Managed Care – PPO | Admitting: Neurology

## 2020-06-13 ENCOUNTER — Other Ambulatory Visit: Payer: Self-pay

## 2020-06-13 ENCOUNTER — Ambulatory Visit
Admission: RE | Admit: 2020-06-13 | Discharge: 2020-06-13 | Disposition: A | Discharge status: Home / Self Care | Payer: BC Managed Care – PPO | Source: Ambulatory Visit | Attending: Neurology | Admitting: Neurology

## 2020-06-13 DIAGNOSIS — R251 Tremor, unspecified: Secondary | ICD-10-CM

## 2020-06-13 DIAGNOSIS — R519 Headache, unspecified: Secondary | ICD-10-CM

## 2020-06-13 DIAGNOSIS — R42 Dizziness and giddiness: Secondary | ICD-10-CM

## 2020-06-13 DIAGNOSIS — R200 Anesthesia of skin: Secondary | ICD-10-CM

## 2020-06-13 DIAGNOSIS — R2681 Unsteadiness on feet: Secondary | ICD-10-CM

## 2020-06-13 DIAGNOSIS — R202 Paresthesia of skin: Secondary | ICD-10-CM

## 2020-06-13 MED ORDER — GADOBENATE DIMEGLUMINE 529 MG/ML IV SOLN
17.0000 mL | Freq: Once | INTRAVENOUS | Status: AC | PRN
  Administered 2020-06-13: 17 mL via INTRAVENOUS

## 2020-08-09 NOTE — Progress Notes (Deleted)
Cardiology Office Note:    Date:  08/09/2020   ID:  Stephanie Lane, DOB [birth year], MRN [id number]  PCP:  Maude Leriche, PA-C  Cardiologist:  No primary care provider on file.  Electrophysiologist:  None   Referring MD: Glendon Axe, MD   No chief complaint on file.   History of Present Illness:    Stephanie Lane is a 40 y.o. female with a hx of hypothyroidism who presents for follow-up.  She was referred by Benjiman Core, PA for evaluation of cardiovascular risk assessment, initially seen on 04/17/2020.  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  Following this episode, developed numbness in her face that persisted for days.  She has an appointment coming up with neurology for evaluation.  Is also been having episodes of lightheadedness and palpitations since that time.  No syncopal episodes but has felt significant lightheadedness.  Having episode of palpitations were feels like her heart is racing.  Occurring multiple times per day, lasts for 3 to 5 minutes.  She denies any chest pain or shortness of breath.  She works out by doing 15 minutes of cardio per day and 30 minutes on the weekend.  She has lost over 100 pounds in the last 2 years.  No smoking history.  Family history includes mother had MI in 42s and maternal grandmother died of MI at age 1.  Zio patch x3 days on 05/12/2020 showed no significant abnormalities, 1 run of SVT lasting 11 beats.  Echocardiogram on 05/12/2020 showed normal biventricular function, no significant valvular disease.  Calcium score on 04/26/2020 was 0.  Since last clinic visit,   Past Medical History:  Diagnosis Date  . Arthritis   . Cancer [facility])     Past Surgical History:  Procedure Laterality Date  . TUMOR EXCISION      Current Medications: No outpatient medications have been marked as taking for the 08/10/20 encounter (Appointment) with Donato Heinz, MD.      Allergies:   Metformin, Other, Stevioside, Amoxicillin, Erythromycin base, Peanut-containing drug products, and Hydrocodone   Social History   Socioeconomic History  . Marital status: Married    Spouse name: Not on file  . Number of children: Not on file  . Years of education: Not on file  . Highest education level: Not on file  Occupational History  . Not on file  Tobacco Use  . Smoking status: Never Smoker  . Smokeless tobacco: Never Used  Vaping Use  . Vaping Use: Never used  Substance and Sexual Activity  . Alcohol use: Not Currently  . Drug use: Not on file  . Sexual activity: Not on file  Other Topics Concern  . Not on file  Social History Narrative   Right handed   Drinks caffeine   One story home   Social Determinants of Health   Financial Resource Strain: Not on file  Food Insecurity: Not on file  Transportation Needs: Not on file  Physical Activity: Not on file  Stress: Not on file  Social Connections: Not on file     Family History: The patient's family history is not on file.  ROS:   Please see the history of present illness.     All other systems reviewed and are negative.  EKGs/Labs/Other Studies Reviewed:    The following studies were reviewed today:   EKG:  EKG is  ordered today.  The ekg ordered today demonstrates normal sinus  rhythm, rate 62, no ST abnormality  Recent Labs: No results found for requested labs within last 8760 hours.  Recent Lipid Panel No results found for: CHOL, TRIG, HDL, CHOLHDL, VLDL, LDLCALC, LDLDIRECT  Physical Exam:    VS:  There were no vitals taken for this visit.    Wt Readings from Last 3 Encounters:  05/21/20 188 lb (85.3 kg)  04/17/20 194 lb 9.6 oz (88.3 kg)  11/03/17 290 lb (131.5 kg)     GEN: Well nourished, well developed in no acute distress HEENT: Normal NECK: No JVD; No carotid bruits LYMPHATICS: No lymphadenopathy CARDIAC: RRR, no murmurs, rubs, gallops RESPIRATORY:  Clear to  auscultation without rales, wheezing or rhonchi  ABDOMEN: Soft, non-tender, non-distended MUSCULOSKELETAL:  No edema; No deformity  SKIN: Warm and dry NEUROLOGIC:  Alert and oriented x 3 PSYCHIATRIC:  Normal affect   ASSESSMENT:    No diagnosis found. PLAN:     Palpitations: Zio patch x3 days on 05/12/2020 showed no significant abnormalities, 1 run of SVT lasting 11 beats.  Echocardiogram on 05/12/2020 showed normal biventricular function, no significant valvular disease.  Suspect episodes likely related to anxiety/panic attacks  Lightheadedness: No structural heart disease on echocardiogram as above  Hyperlipidemia: LDL 167 on 11/28/2019.  Does not meet indication for statin at this time but given her family history.  Calcium score on 04/26/2020 was 0, no indication for statin at this time  RTC in ***   Medication Adjustments/Labs and Tests Ordered: Current medicines are reviewed at length with the patient today.  Concerns regarding medicines are outlined above.  No orders of the defined types were placed in this encounter.  No orders of the defined types were placed in this encounter.   There are no Patient Instructions on file for this visit.   Signed, Donato Heinz, MD  08/09/2020 3:06 PM    [facility]

## 2020-08-10 ENCOUNTER — Other Ambulatory Visit: Payer: Self-pay

## 2020-08-10 ENCOUNTER — Ambulatory Visit: Payer: BC Managed Care – PPO | Admitting: Cardiology

## 2020-08-10 VITALS — BP 120/76 | HR 79 | Ht 61.0 in

## 2020-08-10 DIAGNOSIS — R42 Dizziness and giddiness: Secondary | ICD-10-CM

## 2020-08-10 DIAGNOSIS — R002 Palpitations: Secondary | ICD-10-CM

## 2020-08-10 DIAGNOSIS — E785 Hyperlipidemia, unspecified: Secondary | ICD-10-CM | POA: Diagnosis not present

## 2020-08-10 NOTE — Patient Instructions (Signed)

## 2020-08-10 NOTE — Progress Notes (Signed)
Cardiology Office Note:    Date:  08/10/2020   ID:  Stephanie Lane, DOB 09-10-1980, MRN [id number]  PCP:  Maude Leriche, PA-C  Cardiologist:  No primary care provider on file.  Electrophysiologist:  None   Referring MD: Glendon Axe, MD   Chief Complaint  Patient presents with  . Palpitations    History of Present Illness:    Stephanie Lane is a 40 y.o. female with a hx of hypothyroidism who presents for follow-up.  She was referred by Benjiman Core, PA for evaluation of cardiovascular risk assessment, initially seen on 04/17/2020.  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  Following this episode, developed numbness in her face that persisted for days.  She has an appointment coming up with neurology for evaluation.  Is also been having episodes of lightheadedness and palpitations since that time.  No syncopal episodes but has felt significant lightheadedness.  Having episode of palpitations were feels like her heart is racing.  Occurring multiple times per day, lasts for 3 to 5 minutes.  She denies any chest pain or shortness of breath.  She works out by doing 15 minutes of cardio per day and 30 minutes on the weekend.  She has lost over 100 pounds in the last 2 years.  No smoking history.  Family history includes mother had MI in 69s and maternal grandmother died of MI at age 46.  Zio patch x3 days on 05/12/2020 showed no significant abnormalities, 1 run of SVT lasting 11 beats.  Echocardiogram on 05/12/2020 showed normal biventricular function, no significant valvular disease.  Calcium score on 04/26/2020 was 0.  Today, she is accompanied by her husband. Since last clinic visit, she reports feeling ok. On 04/29/2020 she had a major episode of palpitations with left arm weakness. The palpitations did not begin improving until the first week of March. She has also had hand tremors that she associates with PTSD. Lately, her  palpitations have become rarer, once or twice since she returned to work in March. Due to workplace changes she is better able to avoid certain stress triggers at her work. She does report some dizziness that she reported to her neurologist and underwent brain MRI.Marland Kitchen Recently, she also has LE edema mainly in her RLE.   Past Medical History:  Diagnosis Date  . Arthritis   . Cancer [facility])     Past Surgical History:  Procedure Laterality Date  . TUMOR EXCISION      Current Medications: Current Meds  Medication Sig  . B Complex-Biotin-FA (SUPER B-100) TABS See admin instructions.  Aris [place] Tree (VITEX EXTRACT PO) Take by mouth.  . Coenzyme Q10 (CO Q 10 PO) Take by mouth daily.  Marland Kitchen EPINEPHrine (EPIPEN 2-PAK) 0.3 mg/0.3 mL IJ SOAJ injection Inject 0.3 mLs (0.3 mg total) into the muscle once for 1 dose.  . hydrocortisone valerate cream (WESTCORT) 0.2 % Apply 1 application topically 2 (two) times daily as needed (itching).   Marland Kitchen levothyroxine (SYNTHROID, LEVOTHROID) 100 MCG tablet Take 100 mcg by mouth daily before breakfast.  . niacin 100 MG tablet Take 100 mg by mouth at bedtime.  Marland Kitchen OVER THE COUNTER MEDICATION Adaptocrine  . PANTOTHENIC ACID PO Take by mouth.  . Red Yeast Rice Extract (RED YEAST RICE PO) Take by mouth.  . triamcinolone cream (KENALOG) 0.1 % Apply 1 application topically 2 (two) times daily as needed for rash. for legs, not for face, breasts or  skin folds, not more than 2 weeks in a month     Allergies:   Metformin, Other, Stevioside, Amoxicillin, Erythromycin base, Peanut-containing drug products, and Hydrocodone   Social History   Socioeconomic History  . Marital status: Married    Spouse name: Not on file  . Number of children: Not on file  . Years of education: Not on file  . Highest education level: Not on file  Occupational History  . Not on file  Tobacco Use  . Smoking status: Never Smoker  . Smokeless tobacco: Never Used  Vaping Use  . Vaping Use: Never used   Substance and Sexual Activity  . Alcohol use: Not Currently  . Drug use: Not on file  . Sexual activity: Not on file  Other Topics Concern  . Not on file  Social History Narrative   Right handed   Drinks caffeine   One story home   Social Determinants of Health   Financial Resource Strain: Not on file  Food Insecurity: Not on file  Transportation Needs: Not on file  Physical Activity: Not on file  Stress: Not on file  Social Connections: Not on file     Family History: The patient's family history is not on file.  ROS:   Please see the history of present illness. (+) Palpitations (+) Dizziness (+) LE edema, right knee (+) Hand tremors All other systems reviewed and are negative.  EKGs/Labs/Other Studies Reviewed:    The following studies were reviewed today:   EKG:   04/17/2020: normal sinus rhythm, rate 62, no ST abnormality 08/10/2020: EKG is not ordered today.   Recent Labs: No results found for requested labs within last 8760 hours.  Recent Lipid Panel No results found for: CHOL, TRIG, HDL, CHOLHDL, VLDL, LDLCALC, LDLDIRECT  Physical Exam:    VS:  BP 120/76   Pulse 79   Ht 5\' 1"  (1.549 m)   BMI 35.52 kg/m     Wt Readings from Last 3 Encounters:  05/21/20 188 lb (85.3 kg)  04/17/20 194 lb 9.6 oz (88.3 kg)  11/03/17 290 lb (131.5 kg)     GEN: Well nourished, well developed in no acute distress HEENT: Normal NECK: No JVD; No carotid bruits CARDIAC: RRR, no murmurs, rubs, gallops RESPIRATORY:  Clear to auscultation without rales, wheezing or rhonchi  ABDOMEN: Soft, non-tender, non-distended MUSCULOSKELETAL:  No edema; No deformity  SKIN: Warm and dry NEUROLOGIC:  Alert and oriented x 3 PSYCHIATRIC:  Normal affect   ASSESSMENT:    1. Palpitations   2. Lightheadedness   3. Hyperlipidemia, unspecified hyperlipidemia type    PLAN:     Palpitations: Zio patch x3 days on 05/12/2020 showed no significant abnormalities, 1 run of SVT lasting 11  beats.  Echocardiogram on 05/12/2020 showed normal biventricular function, no significant valvular disease.  Suspect episodes likely related to anxiety/panic attacks  Lightheadedness: No structural heart disease on echocardiogram as above  Hyperlipidemia: LDL 167 on 11/28/2019.  Does not meet indication for statin at this time but given her family history.  Calcium score on 04/26/2020 was 0, no indication for statin at this time  RTC in 1 year.   Medication Adjustments/Labs and Tests Ordered: Current medicines are reviewed at length with the patient today.  Concerns regarding medicines are outlined above.  No orders of the defined types were placed in this encounter.  No orders of the defined types were placed in this encounter.   Patient Instructions  Medication Instructions:  Your physician  recommends that you continue on your current medications as directed. Please refer to the Current Medication list given to you today.  *If you need a refill on your cardiac medications before your next appointment, please call your pharmacy*  Follow-Up: At [facility], you and your health needs are our priority.  As part of our continuing mission to provide you with exceptional heart care, we have created designated Provider Care Teams.  These Care Teams include your primary Cardiologist (physician) and Advanced Practice Providers (APPs -  Physician Assistants and Nurse Practitioners) who all work together to provide you with the care you need, when you need it.  We recommend signing up for the patient portal called "MyChart".  Sign up information is provided on this After Visit Summary.  MyChart is used to connect with patients for Virtual Visits (Telemedicine).  Patients are able to view lab/test results, encounter notes, upcoming appointments, etc.  Non-urgent messages can be sent to your provider as well.   To learn more about what you can do with MyChart, go to [email or web address].    Your  next appointment:   12 month(s)  The format for your next appointment:   In Person  Provider:   Oswaldo Milian, MD         [facility]ump[facility],acting as a scribe for Donato Heinz, MD.,have documented all relevant documentation on the behalf of Donato Heinz, MD,as directed by  Donato Heinz, MD while in the presence of Donato Heinz, MD.  I, Donato Heinz, MD, have reviewed all documentation for this visit. The documentation on 08/10/20 for the exam, diagnosis, procedures, and orders are all accurate and complete.   Signed, Donato Heinz, MD  08/10/2020 5:39 PM    [place]

## 2020-11-26 NOTE — Progress Notes (Signed)
NEUROLOGY FOLLOW UP OFFICE NOTE  Nick Mcdonnell EX:[id number]  Assessment/Plan:   Dizziness - may be related to anxiety Now reports new headaches, possibly migrainous - likely triggered by ongoing emotional stress Numbness, tingling and pain in hands.  Consider atypical presentation of carpal tunnel syndrome Abnormal white matter changes on brain MRI - nonspecific and not consistent pattern specifically for MS.  Migraine management:  She will try lifestyle modification - stress reduction, hydration, diet/monitor for food triggers, proper sleep hygiene, exercise, consider vitamins/supplements (magnesium, B2, CoQ10) May use Advil, Tylenol or Excedrin but limit use of pain relievers to no more than 2 days out of week to prevent risk of rebound or medication-overuse headache. Keep headache diary NCV-EMG upper extremities Repeat MRI of brain with and without contrast in March Follow up after repeat MRI.   Subjective:  Danylle Mhoon is a 40 year old right-handed female with hypothyroidism and PTSD/anxiety who follows up for dizziness.  UPDATE: MRI of brain with and without contrast on 06/13/2020 personally reviewed showed a nonspecific remote T2/FLAIR hyperintense focus but otherwise unremarkable.  Followed up with cardiology for palpitations and dizziness.  Workup including Zio patch and echocardiogram unremarkable.    She reports new headaches that started a month ago. It is a non-throbbing pain on top of her head.  Associated with photophobia, trouble focusing and possibly mild nausea.  It lasts 15 minutes.  Initially occurred every other day but now daily.  Treats with Tylenol or Advil.  She reports increased emotional stress and has been out of work for 6 weeks because of it.    Since early July, she reports intermittent pain and numbness in  her hands.  It is a sharp pain in the mid palm of both hands with numbness and tingling involving the thumb and ring finger.  Not  necessarily positional or associated with a particular activity.  No neck pain.  HISTORY: On 02/25/2020 she was at work and received some distressing news.  She then developed a severe headache with palpitations, out of body sensation and left arm weakness.  She developed clenching of the of the left side of her face that lasted a week.  She has PTSD and this event aggravated her symptoms.  She then developed nightly panic attacks and elevated blood pressure.  After this event, she started experiencing nightly panic attacks and elevated blood pressure, hyperventilation; she felt nauseous for 3 weeks.  She was unable to process information and concentrate for 6 weeks.  She would have throbbing left occipital headaches off and on.    She noted diffuse weakness for 6 weeks.  Loss of appetite for 3 1/2 weeks.  Tremor with numbness in the index finger and thumb of her right hand.  No neck pain.  She feels off-balance when she walks but no falls.  She has been out of work due to these symptoms.  She has history of brief vertigo lasting a few seconds.  For palpitations, she was evaluated by cardiology.  Holter monitor normal.  Echocardiogram was normal except for trivial MVR.  Overall, symptoms have steadily been improving.   PAST MEDICAL HISTORY: Past Medical History:  Diagnosis Date   Arthritis    Cancer [facility])     MEDICATIONS: Current Outpatient Medications on File Prior to Visit  Medication Sig Dispense Refill   B Complex-Biotin-FA (SUPER B-100) TABS See admin instructions.     Chaste Tree (VITEX EXTRACT PO) Take by mouth.     Coenzyme Q10 (CO Q  10 PO) Take by mouth daily.     diazepam (VALIUM) 5 MG tablet Take 40 minutes prior to MRI.  Must have a driver to and from the MRI. 1 tablet 0   EPINEPHrine (EPIPEN 2-PAK) 0.3 mg/0.3 mL IJ SOAJ injection Inject 0.3 mLs (0.3 mg total) into the muscle once for 1 dose. 1 Device 0   hydrocortisone valerate cream (WESTCORT) 0.2 % Apply 1 application topically 2  (two) times daily as needed (itching).      levothyroxine (SYNTHROID, LEVOTHROID) 100 MCG tablet Take 100 mcg by mouth daily before breakfast.  4   niacin 100 MG tablet Take 100 mg by mouth at bedtime.     OVER THE COUNTER MEDICATION Adaptocrine     PANTOTHENIC ACID PO Take by mouth.     Red Yeast Rice Extract (RED YEAST RICE PO) Take by mouth.     triamcinolone cream (KENALOG) 0.1 % Apply 1 application topically 2 (two) times daily as needed for rash. for legs, not for face, breasts or skin folds, not more than 2 weeks in a month  1   No current facility-administered medications on file prior to visit.    ALLERGIES: Allergies  Allergen Reactions   Metformin Other (See Comments) and Diarrhea   Other Swelling and Itching   Stevioside Dermatitis, Hives, Itching and Swelling   Amoxicillin    Erythromycin Base    Peanut-Containing Drug Products Swelling   Hydrocodone Other (See Comments) and Nausea Only    FAMILY HISTORY: No family history on file.    Objective:  Blood pressure (!) 145/83, pulse 74, height '5\' 1"'$  (1.549 m), weight 207 lb 12.8 oz (94.3 kg), last menstrual period 11/22/2020, SpO2 100 %. General: No acute distress.  Patient appears well-groomed.   Head:  Normocephalic/atraumatic Eyes:  Fundi examined but not visualized Neck: supple, no paraspinal tenderness, full range of motion Heart:  Regular rate and rhythm Lungs:  Clear to auscultation bilaterally Back: No paraspinal tenderness Neurological Exam: alert and oriented to person, place, and time.  Speech fluent and not dysarthric, language intact.  CN II-XII intact. Bulk and tone normal, muscle strength 5/5 throughout.  Sensation to light touch intact.  Deep tendon reflexes 2+ throughout, toes downgoing.  Finger to nose testing intact.  Gait normal, Romberg negative.   Metta Clines, DO  CC: Maude Leriche, PA-C

## 2020-11-27 ENCOUNTER — Ambulatory Visit: Payer: BC Managed Care – PPO | Admitting: Neurology

## 2020-11-27 ENCOUNTER — Encounter: Payer: Self-pay | Admitting: Neurology

## 2020-11-27 ENCOUNTER — Other Ambulatory Visit: Payer: Self-pay

## 2020-11-27 VITALS — BP 145/83 | HR 74 | Ht 61.0 in | Wt 207.8 lb

## 2020-11-27 DIAGNOSIS — G8929 Other chronic pain: Secondary | ICD-10-CM

## 2020-11-27 DIAGNOSIS — G43009 Migraine without aura, not intractable, without status migrainosus: Secondary | ICD-10-CM

## 2020-11-27 DIAGNOSIS — R200 Anesthesia of skin: Secondary | ICD-10-CM

## 2020-11-27 DIAGNOSIS — R519 Headache, unspecified: Secondary | ICD-10-CM

## 2020-11-27 DIAGNOSIS — R42 Dizziness and giddiness: Secondary | ICD-10-CM

## 2020-11-27 DIAGNOSIS — Z09 Encounter for follow-up examination after completed treatment for conditions other than malignant neoplasm: Secondary | ICD-10-CM | POA: Diagnosis not present

## 2020-11-27 DIAGNOSIS — N921 Excessive and frequent menstruation with irregular cycle: Secondary | ICD-10-CM | POA: Insufficient documentation

## 2020-11-27 NOTE — Patient Instructions (Signed)
  Will order a nerve conduction study of the upper extremities Repeat MRI of brain with and without contrast in March 2023 - follow up afterwards Limit use of pain relievers to no more than 2 days out of the week.  These medications include acetaminophen, NSAIDs (ibuprofen/Advil/Motrin, naproxen/Aleve, triptans (Imitrex/sumatriptan), Excedrin, and narcotics.  This will help reduce risk of rebound headaches. Be aware of common food triggers:  - Caffeine:  coffee, black tea, cola, Mt. Dew  - Chocolate  - Dairy:  aged cheeses (brie, blue, cheddar, gouda, [place], provolone, Murphy, Swiss, etc), chocolate milk, buttermilk, sour cream, limit eggs and yogurt  - Nuts, peanut butter  - Alcohol  - Cereals/grains:  FRESH breads (fresh bagels, sourdough, doughnuts), yeast productions  - Processed/canned/aged/cured meats (pre-packaged deli meats, hotdogs)  - MSG/glutamate:  soy sauce, flavor enhancer, pickled/preserved/marinated foods  - Sweeteners:  aspartame (Equal, Nutrasweet).  Sugar and Splenda are okay  - Vegetables:  legumes (lima beans, lentils, snow peas, fava beans, pinto peans, peas, garbanzo beans), sauerkraut, onions, olives, pickles  - Fruit:  avocados, bananas, citrus fruit (orange, lemon, grapefruit), mango  - Other:  Frozen meals, macaroni and cheese Routine exercise Stay adequately hydrated (aim for 64 oz water daily) Keep headache diary Maintain proper stress management Maintain proper sleep hygiene Do not skip meals Consider supplements:  magnesium citrate '400mg'$  daily, riboflavin '400mg'$  daily, coenzyme Q10 '100mg'$  three times daily.

## 2020-12-23 ENCOUNTER — Telehealth: Payer: Self-pay | Admitting: Neurology

## 2020-12-23 NOTE — Telephone Encounter (Signed)
Patient wanted to know why she was advised that her PA for her MRI will Expire in October when it is schedule for March 2023.  Per Pt ov notes from 11/2020 pt to repeat her Mri in a year from her lst one 06/2020.    Sent a message to Many to check on it and let us know.

## 2020-12-23 NOTE — Telephone Encounter (Signed)
Pt is calling to get clarification about her MRI's. She has a few questions.

## 2020-12-29 ENCOUNTER — Encounter: Payer: BC Managed Care – PPO | Admitting: Neurology

## 2021-01-12 ENCOUNTER — Ambulatory Visit: Payer: BC Managed Care – PPO | Admitting: Neurology

## 2021-01-12 ENCOUNTER — Other Ambulatory Visit: Payer: Self-pay

## 2021-01-12 DIAGNOSIS — R200 Anesthesia of skin: Secondary | ICD-10-CM

## 2021-01-12 NOTE — Procedures (Signed)
[facility]  [place], [address]  [place], [place] [address] Tel: [phone] Fax:  [phone] Test Date:  01/12/2021  Patient: Stephanie Lane DOB: Oct 12, 1980 Physician: Narda Amber, DO  Sex: Female Height: 5\' 1"  Ref Phys: Metta Clines, D.O.  ID#: [id number]   Technician:    Patient Complaints: This is a 40 year old female referred for evaluation of bilateral arm paresthesias.  NCV & EMG Findings: Extensive electrodiagnostic testing of the right upper extremity and additional studies of the left shows: Bilateral median, ulnar, and mixed palmar sensory responses are within normal limits. Bilateral median and ulnar motor responses are within normal limits. There is no evidence of active or chronic motor axonal loss changes affecting any of the tested muscles.  Motor unit configuration and recruitment pattern is within normal limits.  Impression: This is a normal study of the upper extremities.  In particular, there is no evidence of carpal tunnel syndrome or a cervical radiculopathy.   ___________________________ Narda Amber, DO    Nerve Conduction Studies Anti Sensory Summary Table   Stim Site NR Peak (ms) Norm Peak (ms) P-T Amp (V) Norm P-T Amp  Left Median Anti Sensory (2nd Digit)  33C  Wrist    3.0 <3.4 70.1 >20  Right Median Anti Sensory (2nd Digit)  33C  Wrist    2.9 <3.4 78.9 >20  Left Ulnar Anti Sensory (5th Digit)  33C  Wrist    2.9 <3.1 71.7 >12  Right Ulnar Anti Sensory (5th Digit)  33C  Wrist    2.8 <3.1 67.8 >12   Motor Summary Table   Stim Site NR Onset (ms) Norm Onset (ms) O-P Amp (mV) Norm O-P Amp Site1 Site2 Delta-0 (ms) Dist (cm) Vel (m/s) Norm Vel (m/s)  Left Median Motor (Abd Poll Brev)  33C  Wrist    3.0 <3.9 11.3 >6 Elbow Wrist 4.5 27.0 60 >50  Elbow    7.5  10.9         Right Median Motor (Abd Poll Brev)  33C  Wrist    2.6 <3.9 11.8 >6 Elbow Wrist 4.1 26.0 63 >50  Elbow    6.7  10.8         Left Ulnar Motor (Abd  Dig Minimi)  33C  Wrist    2.7 <3.1 13.9 >7 B Elbow Wrist 3.6 21.0 58 >50  B Elbow    6.3  13.4  A Elbow B Elbow 1.7 10.0 59 >50  A Elbow    8.0  13.2         Right Ulnar Motor (Abd Dig Minimi)  33C  Wrist    2.3 <3.1 13.7 >7 B Elbow Wrist 3.4 21.0 62 >50  B Elbow    5.7  13.2  A Elbow B Elbow 1.8 10.0 56 >50  A Elbow    7.5  13.1          Comparison Summary Table   Stim Site NR Peak (ms) Norm Peak (ms) P-T Amp (V) Site1 Site2 Delta-P (ms) Norm Delta (ms)  Left Median/Ulnar Palm Comparison (Wrist - 8cm)  33C  Median Palm    1.8 <2.2 90.4 Median Palm Ulnar Palm 0.3   Ulnar Palm    1.5 <2.2 28.5      Right Median/Ulnar Palm Comparison (Wrist - 8cm)  33C  Median Palm    1.6 <2.2 93.1 Median Palm Ulnar Palm 0.0   Ulnar Palm    1.6 <2.2 37.3  EMG   Side Muscle Ins Act Fibs Psw Fasc Number Recrt Dur Dur. Amp Amp. Poly Poly. Comment  Right 1stDorInt Nml Nml Nml Nml Nml Nml Nml Nml Nml Nml Nml Nml N/A  Right PronatorTeres Nml Nml Nml Nml Nml Nml Nml Nml Nml Nml Nml Nml N/A  Right Biceps Nml Nml Nml Nml Nml Nml Nml Nml Nml Nml Nml Nml N/A  Right Triceps Nml Nml Nml Nml Nml Nml Nml Nml Nml Nml Nml Nml N/A  Right Deltoid Nml Nml Nml Nml Nml Nml Nml Nml Nml Nml Nml Nml N/A  Left 1stDorInt Nml Nml Nml Nml Nml Nml Nml Nml Nml Nml Nml Nml N/A  Left PronatorTeres Nml Nml Nml Nml Nml Nml Nml Nml Nml Nml Nml Nml N/A  Left Biceps Nml Nml Nml Nml Nml Nml Nml Nml Nml Nml Nml Nml N/A  Left Triceps Nml Nml Nml Nml Nml Nml Nml Nml Nml Nml Nml Nml N/A  Left Deltoid Nml Nml Nml Nml Nml Nml Nml Nml Nml Nml Nml Nml N/A      Waveforms:

## 2021-01-14 NOTE — Progress Notes (Signed)
Tried calling pt, No answer. LMOVM to call the office back.

## 2021-01-15 ENCOUNTER — Telehealth: Payer: Self-pay | Admitting: Neurology

## 2021-01-15 NOTE — Telephone Encounter (Signed)
Pt said she is returning a call regarding results.

## 2021-01-15 NOTE — Telephone Encounter (Signed)
Tried calling pt back no answer. LMOVM.

## 2021-01-18 NOTE — Telephone Encounter (Signed)
Pt called back in returning [facility] call. She stated she will be available after 3:00 PM today

## 2021-01-20 NOTE — Telephone Encounter (Signed)
LMOVM

## 2021-01-21 NOTE — Telephone Encounter (Signed)
Pt advised of dr.Jaffe note below, Nerve test is normal.  However, in case she may have a very mild carpal tunnel syndrome, would still suggest try wearing wrist splints (especially at night) to see if symptoms improve.

## 2021-01-22 ENCOUNTER — Other Ambulatory Visit: Payer: Self-pay

## 2021-01-22 DIAGNOSIS — R200 Anesthesia of skin: Secondary | ICD-10-CM

## 2021-01-22 NOTE — Progress Notes (Signed)
We can provide her a prescription for bilateral wrist splints that she can pick up at a medical supply store.  Since she has symptoms in both wrists, she should wear one for each wrist

## 2021-04-22 ENCOUNTER — Other Ambulatory Visit: Payer: Self-pay | Admitting: Obstetrics and Gynecology

## 2021-04-22 DIAGNOSIS — Z1231 Encounter for screening mammogram for malignant neoplasm of breast: Secondary | ICD-10-CM

## 2021-05-18 ENCOUNTER — Other Ambulatory Visit: Payer: Self-pay

## 2021-05-18 ENCOUNTER — Ambulatory Visit
Admission: RE | Admit: 2021-05-18 | Discharge: 2021-05-18 | Disposition: A | Discharge status: Home / Self Care | Payer: BC Managed Care – PPO | Source: Ambulatory Visit | Attending: Obstetrics and Gynecology | Admitting: Obstetrics and Gynecology

## 2021-05-18 DIAGNOSIS — Z1231 Encounter for screening mammogram for malignant neoplasm of breast: Secondary | ICD-10-CM | POA: Insufficient documentation

## 2021-06-07 ENCOUNTER — Encounter: Payer: Self-pay | Admitting: Neurology

## 2021-06-08 ENCOUNTER — Other Ambulatory Visit: Payer: Self-pay | Admitting: Neurology

## 2021-06-08 MED ORDER — DIAZEPAM 5 MG PO TABS: ORAL_TABLET | ORAL | 0 refills | Status: DC

## 2021-06-19 ENCOUNTER — Other Ambulatory Visit: Payer: BC Managed Care – PPO

## 2021-07-05 ENCOUNTER — Telehealth: Payer: Self-pay | Admitting: Neurology

## 2021-07-05 NOTE — Telephone Encounter (Signed)
Patient called to report she will delay having her MRI due to a patient responsibility of $1,200.00. ? ?Patient plans on keeping her follow up appointment in April 2023, however. ?

## 2021-07-23 NOTE — Progress Notes (Signed)
? ?NEUROLOGY FOLLOW UP OFFICE NOTE ? ?Stephanie Lane ?[id number] ? ?Assessment/Plan:  ? ?Left upper extremity numbness, pain and weakness.  May be radiculopathy.  As she has failed conservative therapy (chiropractor), would get MRI of cervical spine to evaluate for structural etiology.  ?Abnormal brain MRI - nonspecific white matter changes.   ?  ?Repeat MRI of brain with and without contrast to evaluate for any changes ?MRI of cervical spine to evaluate for any structural cause of left upper extremity symptoms.   ?Follow up after testing. ?  ?  ?Subjective:  ?Stephanie Lane is a 41 year old right-handed female with hypothyroidism and PTSD/anxiety who follows up for dizziness. ?  ?UPDATE: ?NCV-EMG of bilateral upper extremities on 01/12/2021 was normal.  Due to previous nonspecific white matter changes on brain MRI, repeat study was recommended in March 2023, which was not performed.   ? ?Dizziness subsequently subsided.  Since 3/25, she notes stiffness and aching on the left posterior side of her neck radiating down to the shoulder blade and to the fingers, and still has numbness and weakness of the arm.  Holding a cell phone feels heavy.  She also notes twitching in her left forearm as well.  No preceding injury.  She sees a Restaurant manager, fast food but treatment has not resolved the problem.   ? ? ?  ?HISTORY: ?On 02/25/2020 she was at work and received some distressing news.  She then developed a severe headache with palpitations, out of body sensation and left arm weakness.  She developed clenching of the of the left side of her face that lasted a week.  She has PTSD and this event aggravated her symptoms.  She then developed nightly panic attacks and elevated blood pressure.  After this event, she started experiencing nightly panic attacks and elevated blood pressure, hyperventilation; she felt nauseous for 3 weeks.  She was unable to process information and concentrate for 6 weeks.  She would have throbbing left  occipital headaches off and on.    She noted diffuse weakness for 6 weeks.  Loss of appetite for 3 1/2 weeks.  Tremor with numbness in the index finger and thumb of her right hand.  No neck pain.  She feels off-balance when she walks but no falls.  She has been out of work due to these symptoms.  She has history of brief vertigo lasting a few seconds.  For palpitations, she was evaluated by cardiology.  Holter monitor normal.  Echocardiogram was normal except for trivial MVR.  MRI of brain with and without contrast on 06/13/2020 showed a nonspecific remote T2/FLAIR hyperintense focus but otherwise unremarkable.  Followed up with cardiology for palpitations and dizziness.  Workup including Zio patch and echocardiogram unremarkable.   ? ?She started having new headaches in summer 2022.. It is a non-throbbing pain on top of her head.  Associated with photophobia, trouble focusing and possibly mild nausea.  It lasts 15 minutes.  Initially occurred every other day but now daily.  Treats with Tylenol or Advil.  ? ?Since summer 2022, she also reports intermittent pain and numbness in  her hands.  It is a sharp pain in the mid palm of both hands with numbness and tingling involving the thumb and ring finger.  Not necessarily positional or associated with a particular activity.  No neck pain. ?  ? ?PAST MEDICAL HISTORY: ?Past Medical History:  ?Diagnosis Date  ? Arthritis   ? Cancer [facility])   ? ? ?MEDICATIONS: ?Current Outpatient Medications on  File Prior to Visit  ?Medication Sig Dispense Refill  ? B Complex-Biotin-FA (SUPER B-100) TABS Take 1 tablet by mouth daily.    ? Chaste Tree (VITEX EXTRACT PO) Take 1 capsule by mouth in the morning, at noon, and at bedtime.    ? Coenzyme Q10 (CO Q 10 PO) Take 1 capsule by mouth daily.    ? diazepam (VALIUM) 5 MG tablet Take 1 tablet 40 minutes prior to MRI 1 tablet 0  ? EPINEPHrine (EPIPEN 2-PAK) 0.3 mg/0.3 mL IJ SOAJ injection Inject 0.3 mLs (0.3 mg total) into the muscle once for 1  dose. 1 Device 0  ? hydrocortisone valerate cream (WESTCORT) 0.2 % Apply 1 application topically 2 (two) times daily as needed (itching).     ? levothyroxine (SYNTHROID, LEVOTHROID) 100 MCG tablet Take 100 mcg by mouth daily before breakfast.  4  ? niacin 100 MG tablet Take 100 mg by mouth at bedtime.    ? OVER THE COUNTER MEDICATION [employer]ocrine    ? Red Yeast Rice Extract (RED YEAST RICE PO) Take 1 capsule by mouth in the morning and at bedtime.    ? triamcinolone cream (KENALOG) 0.1 % Apply 1 application topically 2 (two) times daily as needed for rash. for legs, not for face, breasts or skin folds, not more than 2 weeks in a month  1  ? ?No current facility-administered medications on file prior to visit.  ? ? ?ALLERGIES: ?Allergies  ?Allergen Reactions  ? Metformin Other (See Comments) and Diarrhea  ? Other Swelling and Itching  ? Stevioside Dermatitis, Hives, Itching and Swelling  ? Amoxicillin   ? Erythromycin Base   ? Peanut-Containing Drug Products Swelling  ? Hydrocodone Other (See Comments) and Nausea Only  ? ? ?FAMILY HISTORY: ?Family History  ?Problem Relation Age of Onset  ? Breast cancer Neg Hx   ? ? ?  ?Objective:  ?Blood pressure 137/87, pulse 85, height '5\' 1"'$  (1.549 m), weight 225 lb (102.1 kg), SpO2 100 %. ?General: No acute distress.  Patient appears well-groomed.   ?Head:  Normocephalic/atraumatic ?Eyes:  Fundi examined but not visualized ?Neck: supple, left sided tightness but no paraspinal tenderness, full range of motion ?Heart:  Regular rate and rhythm ?Neurological Exam: alert and oriented to person, place, and time.  Speech fluent and not dysarthric, language intact.  Endorses reduced left V2.  Otherwise, CN II-XII intact. Bulk and tone normal, muscle strength 5-/5 left upper extremity otherwise 5/5 throughout.  Sensation to pinprick reduced in left upper extremity.  Vibratory sensation intact.  Deep tendon reflexes 2+ throughout, toes downgoing.  Finger to nose testing intact.  Gait  normal, Romberg negative. ? ? ?Metta Clines, DO ? ?CC: Dorothy Engineer, production, PA-C ? ? ? ? ? ? ?

## 2021-07-26 ENCOUNTER — Ambulatory Visit: Payer: BC Managed Care – PPO | Admitting: Neurology

## 2021-07-26 ENCOUNTER — Encounter: Payer: Self-pay | Admitting: Neurology

## 2021-07-26 VITALS — BP 137/87 | HR 85 | Ht 61.0 in | Wt 225.0 lb

## 2021-07-26 DIAGNOSIS — R29898 Other symptoms and signs involving the musculoskeletal system: Secondary | ICD-10-CM | POA: Diagnosis not present

## 2021-07-26 DIAGNOSIS — R200 Anesthesia of skin: Secondary | ICD-10-CM | POA: Diagnosis not present

## 2021-07-26 DIAGNOSIS — R9082 White matter disease, unspecified: Secondary | ICD-10-CM

## 2021-07-26 NOTE — Patient Instructions (Signed)
Check MRI of brain with and without contrast ?Check MRI of cervical spine with and without contrast ?Further recommendations pending results ?Follow up after testing. ?

## 2021-10-04 NOTE — Progress Notes (Signed)
Cardiology Office Note:    Date:  10/05/2021   ID:  Stephanie Lane, DOB [birth year], MRN [id number]  PCP:  Scifres, Nicole Cella, PA-C (Inactive)  Cardiologist:  None  Electrophysiologist:  None   Referring MD: No ref. provider found   Chief Complaint  Patient presents with   Palpitations    History of Present Illness:    Stephanie Lane is a 41 y.o. female with a hx of hypothyroidism who presents for follow-up.  She was referred by [name], PA for evaluation of cardiovascular risk assessment, initially seen on 04/17/2020.  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  Following this episode, developed numbness in her face that persisted for days.  She has an appointment coming up with neurology for evaluation.  Is also been having episodes of lightheadedness and palpitations since that time.  No syncopal episodes but has felt significant lightheadedness.  Having episode of palpitations were feels like her heart is racing.  Occurring multiple times per day, lasts for 3 to 5 minutes.  She denies any chest pain or shortness of breath.  She works out by doing 15 minutes of cardio per day and 30 minutes on the weekend.  She has lost over 100 pounds in the last 2 years.  No smoking history.  Family history includes mother had MI in 37s and maternal grandmother died of MI at age 72.  Zio patch x3 days on 05/12/2020 showed no significant abnormalities, 1 run of SVT lasting 11 beats.  Echocardiogram on 05/12/2020 showed normal biventricular function, no significant valvular disease.  Calcium score on 04/26/2020 was 0.  Since last clinic visit, she reports she has been doing okay.  States that she has been having palpitations that she describes as fluttering feeling in her heart, started in April.  Feels dizzy/lightheaded during episodes.  Reports heart rate has been up to 120s.  Occurred 3-4 times last month.  Now happening more frequently.   Last for 15 to 30 minutes.  Denies any syncope.  Denies any chest pain, dyspnea, or lower extremity edema.  She is not exercising.  Has been told she snores.   Past Medical History:  Diagnosis Date   Arthritis    Cancer [facility])     Past Surgical History:  Procedure Laterality Date   TUMOR EXCISION      Current Medications: Current Meds  Medication Sig   B Complex-Biotin-FA (SUPER B-100) TABS Take 1 tablet by mouth daily.   Chaste Tree (VITEX EXTRACT PO) Take 1 capsule by mouth in the morning, at noon, and at bedtime.   Cholecalciferol (VITAMIN D-3) 125 MCG (5000 UT) TABS Take 1 tablet by mouth daily.   diazepam (VALIUM) 5 MG tablet Take 1 tablet 40 minutes prior to MRI   EPINEPHrine (EPIPEN 2-PAK) 0.3 mg/0.3 mL IJ SOAJ injection Inject 0.3 mLs (0.3 mg total) into the muscle once for 1 dose.   hydrocortisone valerate cream (WESTCORT) 0.2 % Apply 1 application topically 2 (two) times daily as needed (itching).    levothyroxine (SYNTHROID, LEVOTHROID) 100 MCG tablet Take 100 mcg by mouth daily before breakfast.   OVER THE COUNTER MEDICATION [employer]ocrine   Prenatal Vit-Fe Fumarate-FA (PRENATAL MULTIVITAMIN) TABS tablet Take 1 tablet by mouth daily at 12 noon.   triamcinolone cream (KENALOG) 0.1 % Apply 1 application topically 2 (two) times daily as needed for rash. for legs, not for face, breasts or skin folds, not more than 2 weeks  in a month     Allergies:   Metformin, Other, Stevioside, Amoxicillin, Erythromycin base, Peanut-containing drug products, Polymyxin b, and Hydrocodone   Social History   Socioeconomic History   Marital status: Married    Spouse name: Husna Brasile   Number of children: Not on file   Years of education: Not on file   Highest education level: Not on file  Occupational History   Not on file  Tobacco Use   Smoking status: Never   Smokeless tobacco: Never  Vaping Use   Vaping Use: Never used  Substance and Sexual Activity   Alcohol use: Not Currently    Drug use: Never   Sexual activity: Yes    Partners: Male  Other Topics Concern   Not on file  Social History Narrative   Right handed   Drinks caffeine   One story home   Social Determinants of Health   Financial Resource Strain: Not on file  Food Insecurity: Not on file  Transportation Needs: Not on file  Physical Activity: Not on file  Stress: Not on file  Social Connections: Not on file     Family History: The patient's family history is negative for Breast cancer.  ROS:   Please see the history of present illness. (+) Palpitations (+) Dizziness (+) LE edema, right knee (+) Hand tremors All other systems reviewed and are negative.  EKGs/Labs/Other Studies Reviewed:    The following studies were reviewed today:   EKG:   04/17/2020: normal sinus rhythm, rate 62, no ST abnormality 10/05/2021: Normal sinus rhythm, rate 76, nonspecific T wave flattening   Recent Labs: No results found for requested labs within last 365 days.  Recent Lipid Panel No results found for: "CHOL", "TRIG", "HDL", "CHOLHDL", "VLDL", "LDLCALC", "LDLDIRECT"  Physical Exam:    VS:  BP 122/72 (BP Location: Right Arm, Patient Position: Sitting, Cuff Size: Large)   Pulse 76   Ht 5\' 1"  (1.549 m)   Wt 228 lb 1.6 oz (103.5 kg)   BMI 43.10 kg/m     Wt Readings from Last 3 Encounters:  10/05/21 228 lb 1.6 oz (103.5 kg)  07/26/21 225 lb (102.1 kg)  11/27/20 207 lb 12.8 oz (94.3 kg)     GEN: Well nourished, well developed in no acute distress HEENT: Normal NECK: No JVD; No carotid bruits CARDIAC: RRR, no murmurs, rubs, gallops RESPIRATORY:  Clear to auscultation without rales, wheezing or rhonchi  ABDOMEN: Soft, non-tender, non-distended MUSCULOSKELETAL:  No edema; No deformity  SKIN: Warm and dry NEUROLOGIC:  Alert and oriented x 3 PSYCHIATRIC:  Normal affect   ASSESSMENT:    1. Palpitations   2. Snoring   3. Lightheadedness   4. Hyperlipidemia, unspecified hyperlipidemia type      PLAN:     Palpitations: Zio patch x3 days on 05/12/2020 showed no significant abnormalities, 1 run of SVT lasting 11 beats.  Echocardiogram on 05/12/2020 showed normal biventricular function, no significant valvular disease.   -She is now having worsening palpitations.  Reports heart rate up to 120s when she checks at home.  Will check Zio patch x2 weeks  Lightheadedness: No structural heart disease on echocardiogram as above  Hyperlipidemia: LDL 146 on 11/27/2020.  Does not meet indication for statin at this time but given her family history, calcium score was ordered.  Calcium score on 04/26/2020 was 0, no indication for statin at this time.  Diet/exercise recommended.  Snoring: Concern for OSA, will check sleep study  RTC in 6  months   Medication Adjustments/Labs and Tests Ordered: Current medicines are reviewed at length with the patient today.  Concerns regarding medicines are outlined above.  Orders Placed This Encounter  Procedures   LONG TERM MONITOR (3-14 DAYS)   EKG 12-Lead   Split night study   No orders of the defined types were placed in this encounter.   Patient Instructions  Medication Instructions:  Your physician recommends that you continue on your current medications as directed. Please refer to the Current Medication list given to you today.  *If you need a refill on your cardiac medications before your next appointment, please call your pharmacy*  Testing/Procedures: Your physician has recommended that you have a sleep study. This test records several body functions during sleep, including: brain activity, eye movement, oxygen and carbon dioxide blood levels, heart rate and rhythm, breathing rate and rhythm, the flow of air through your mouth and nose, snoring, body muscle movements, and chest and belly movement.  ZIO XT- Long Term Monitor Instructions   Your physician has requested you wear a ZIO patch monitor for _14_ days.  This is a single patch monitor.    [employer] supplies one patch monitor per enrollment. Additional stickers are not available. Please do not apply patch if you will be having a Nuclear Stress Test, Echocardiogram, Cardiac CT, MRI, or Chest Xray during the period you would be wearing the monitor. The patch cannot be worn during these tests. You cannot remove and re-apply the ZIO XT patch monitor.  Your ZIO patch monitor will be sent Fed Ex from [employer] directly to your home address. It may take 3-5 days to receive your monitor after you have been enrolled.  Once you have received your monitor, please review the enclosed instructions. Your monitor has already been registered assigning a specific monitor serial # to you.  Billing and Patient Assistance Program Information   We have supplied [employer] with any of your insurance information on file for billing purposes. [employer] offers a sliding scale Patient Assistance Program for patients that do not have insurance, or whose insurance does not completely cover the cost of the ZIO monitor.   You must apply for the Patient Assistance Program to qualify for this discounted rate.     To apply, please call [employer] at [phone], select option 4, then select option 2, and ask to apply for Patient Assistance Program.  Meredeth Ide will ask your household income, and how many people are in your household.  They will quote your out-of-pocket cost based on that information.  [employer] will also be able to set up a 32-month, interest-free payment plan if needed.  Applying the monitor   Shave hair from upper left chest.  Hold abrader disc by orange tab. Rub abrader in 40 strokes over the upper left chest as indicated in your monitor instructions.  Clean area with 4 enclosed alcohol pads. Let dry.  Apply patch as indicated in monitor instructions. Patch will be placed under collarbone on left side of chest with arrow pointing upward.  Rub patch adhesive wings for 2 minutes. Remove white label  marked "1". Remove the white label marked "2". Rub patch adhesive wings for 2 additional minutes.  While looking in a mirror, press and release button in center of patch. A small green light will flash 3-4 times. This will be your only indicator that the monitor has been turned on. ?  Do not shower for the first 24 hours. You may shower after the first  24 hours.  Press the button if you feel a symptom. You will hear a small click. Record Date, Time and Symptom in the Patient Logbook.  When you are ready to remove the patch, follow instructions on the last 2 pages of the Patient Logbook. Stick patch monitor onto the last page of Patient Logbook.  Place Patient Logbook in the blue and white box.  Use locking tab on box and tape box closed securely.  The blue and white box has prepaid postage on it. Please place it in the mailbox as soon as possible. Your physician should have your test results approximately 7 days after the monitor has been mailed back to [facility].  Call [facility] Customer Care at [phone] if you have questions regarding your ZIO XT patch monitor. Call them immediately if you see an orange light blinking on your monitor.  If your monitor falls off in less than 4 days, contact our Monitor department at [phone]. ?If your monitor becomes loose or falls off after 4 days call [employer] at [phone] for suggestions on securing your monitor.?   Follow-Up: At [facility], you and your health needs are our priority.  As part of our continuing mission to provide you with exceptional heart care, we have created designated Provider Care Teams.  These Care Teams include your primary Cardiologist (physician) and Advanced Practice Providers (APPs -  Physician Assistants and Nurse Practitioners) who all work together to provide you with the care you need, when you need it.  We recommend signing up for the patient portal called "MyChart".  Sign up information is provided on this  After Visit Summary.  MyChart is used to connect with patients for Virtual Visits (Telemedicine).  Patients are able to view lab/test results, encounter notes, upcoming appointments, etc.  Non-urgent messages can be sent to your provider as well.   To learn more about what you can do with MyChart, go to [email or web address].    Your next appointment:   6 month(s)  The format for your next appointment:   In Person  Provider:   Dr. Bjorn Pippin  Important Information About Sugar          Signed, Little Ishikawa, MD  10/05/2021 8:57 AM    [facility]

## 2021-10-05 ENCOUNTER — Ambulatory Visit (HOSPITAL_BASED_OUTPATIENT_CLINIC_OR_DEPARTMENT_OTHER): Payer: BC Managed Care – PPO | Admitting: Cardiology

## 2021-10-05 ENCOUNTER — Ambulatory Visit: Payer: BC Managed Care – PPO

## 2021-10-05 VITALS — BP 122/72 | HR 76 | Ht 61.0 in | Wt 228.1 lb

## 2021-10-05 DIAGNOSIS — R002 Palpitations: Secondary | ICD-10-CM

## 2021-10-05 DIAGNOSIS — R42 Dizziness and giddiness: Secondary | ICD-10-CM | POA: Diagnosis not present

## 2021-10-05 DIAGNOSIS — E785 Hyperlipidemia, unspecified: Secondary | ICD-10-CM | POA: Diagnosis not present

## 2021-10-05 DIAGNOSIS — R0683 Snoring: Secondary | ICD-10-CM

## 2021-11-15 ENCOUNTER — Ambulatory Visit (HOSPITAL_BASED_OUTPATIENT_CLINIC_OR_DEPARTMENT_OTHER): Payer: BC Managed Care – PPO | Attending: Cardiology | Admitting: Cardiology

## 2021-11-15 DIAGNOSIS — E669 Obesity, unspecified: Secondary | ICD-10-CM | POA: Insufficient documentation

## 2021-11-15 DIAGNOSIS — R5383 Other fatigue: Secondary | ICD-10-CM | POA: Diagnosis not present

## 2021-11-15 DIAGNOSIS — R0683 Snoring: Secondary | ICD-10-CM | POA: Insufficient documentation

## 2021-11-16 NOTE — Procedures (Signed)
   Patient Name: Stephanie Lane, Stephanie Lane Study Date:11/15/2021 Gender: Female D.O.B: 16-Nov-1980 Age (years): 67 Referring Provider: Oswaldo Milian Height (inches): 61 Interpreting Physician: Fransico Him MD, ABSM Weight (lbs): 230 RPSGT: Carolin Coy BMI: 41 MRN: [id number] Neck Size: 13.00  CLINICAL INFORMATION Sleep Study Type: NPSG  Indication for sleep study: Fatigue, Obesity  Epworth Sleepiness Score: 0  SLEEP STUDY TECHNIQUE As per the AASM Manual for the Scoring of Sleep and Associated Events v2.3 (April 2016) with a hypopnea requiring 4% desaturations.  The channels recorded and monitored were frontal, central and occipital EEG, electrooculogram (EOG), submentalis EMG (chin), nasal and oral airflow, thoracic and abdominal wall motion, anterior tibialis EMG, snore microphone, electrocardiogram, and pulse oximetry.  MEDICATIONS Medications self-administered by patient taken the night of the study : N/A  SLEEP ARCHITECTURE The study was initiated at 10:04:00 PM and ended at 5:03:13 AM.  Sleep onset time was 61.2 minutes and the sleep efficiency was 79.7%. The total sleep time was 334 minutes.  Stage REM latency was 135.5 minutes.  The patient spent 11.5% of the night in stage N1 sleep, 74.0% in stage N2 sleep, 0.0% in stage N3 and 14.5% in REM.  Alpha intrusion was absent.  Supine sleep was 45.06%.  RESPIRATORY PARAMETERS The overall apnea/hypopnea index (AHI) was 0.2 per hour. There were 0 total apneas, including 0 obstructive, 0 central and 0 mixed apneas. There were 1 hypopneas and 27 RERAs.  The AHI during Stage REM sleep was 1.2 per hour.  AHI while supine was 0.4 per hour.  The mean oxygen saturation was 96.4%. The minimum SpO2 during sleep was 93.0%.  snoring was noted during this study.  CARDIAC DATA The 2 lead EKG demonstrated sinus rhythm. The mean heart rate was 78.3 beats per minute. Other EKG findings include: None.  LEG MOVEMENT  DATA The total PLMS were 0 with a resulting PLMS index of 0.0. Associated arousal with leg movement index was 0.0 .  IMPRESSIONS - No significant obstructive sleep apnea occurred during this study (AHI = 0.2/h). - The patient had minimal or no oxygen desaturation during the study (Min O2 = 93.0%) - No snoring was audible during this study. - No cardiac abnormalities were noted during this study. - Clinically significant periodic limb movements did not occur during sleep. No significant associated arousals.  DIAGNOSIS - Normal Study  RECOMMENDATIONS - Avoid alcohol, sedatives and other CNS depressants that may worsen sleep apnea and disrupt normal sleep architecture. - Sleep hygiene should be reviewed to assess factors that may improve sleep quality. - Weight management and regular exercise should be initiated or continued if appropriate.  [Electronically signed] 11/16/2021 12:40 PM  Fransico Him MD, ABSM Diplomate, American Board of Sleep Medicine

## 2021-11-26 ENCOUNTER — Telehealth: Payer: Self-pay | Admitting: *Deleted

## 2021-11-26 NOTE — Telephone Encounter (Signed)
-----   Message from Lauralee Evener, [place] sent at 11/17/2021  1:20 PM EDT -----  ----- Message ----- From: Sueanne Margarita, MD Sent: 11/16/2021  12:41 PM EDT To: Cv Div Sleep Studies  Please let patient know that sleep study showed no significant sleep apnea.

## 2021-11-26 NOTE — Telephone Encounter (Signed)
The patient has been notified of the result and verbalized understanding.  All questions (if any) were answered. Marolyn Hammock, [place] 11/26/2021 1:24 PM   Pt is aware and agreeable to normal results

## 2022-02-19 IMAGING — MG MM DIGITAL SCREENING BILAT W/ TOMO AND CAD
6 of 10 series · 6 of 30 positions shown · non-contrast
Comparison: None.

CLINICAL DATA: Screening.

EXAM:
DIGITAL SCREENING BILATERAL MAMMOGRAM WITH TOMOSYNTHESIS AND CAD
TECHNIQUE: Bilateral screening digital craniocaudal and mediolateral oblique
mammograms were obtained. Bilateral screening digital breast
tomosynthesis was performed. The images were evaluated with
computer-aided detection.

[R MLO synth-2D (1 of 2)]
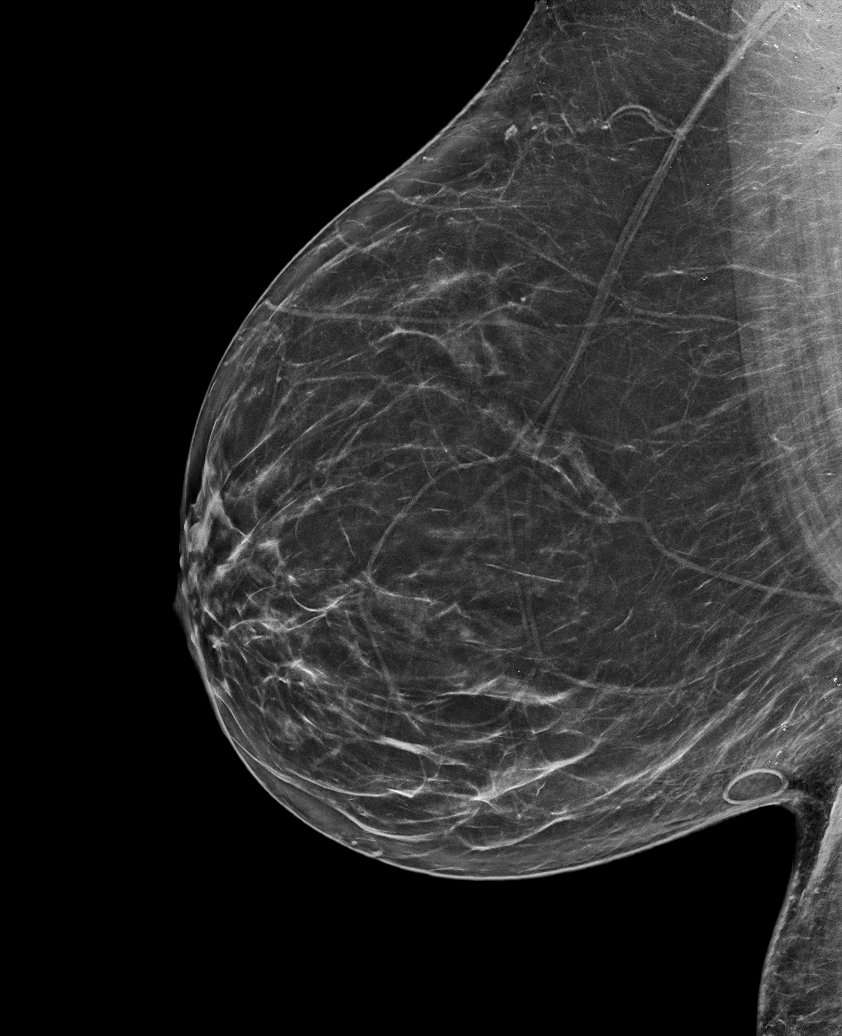

[R MLO synth-2D (2 of 2)]
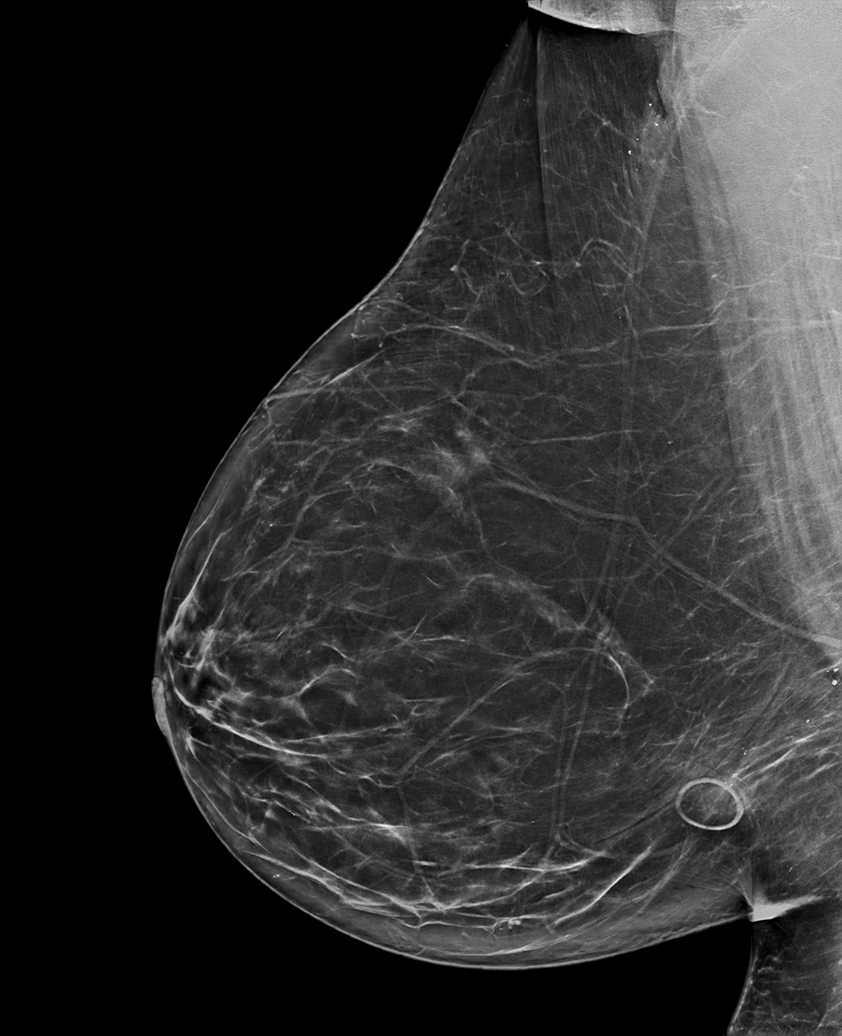

[L CC synth-2D]
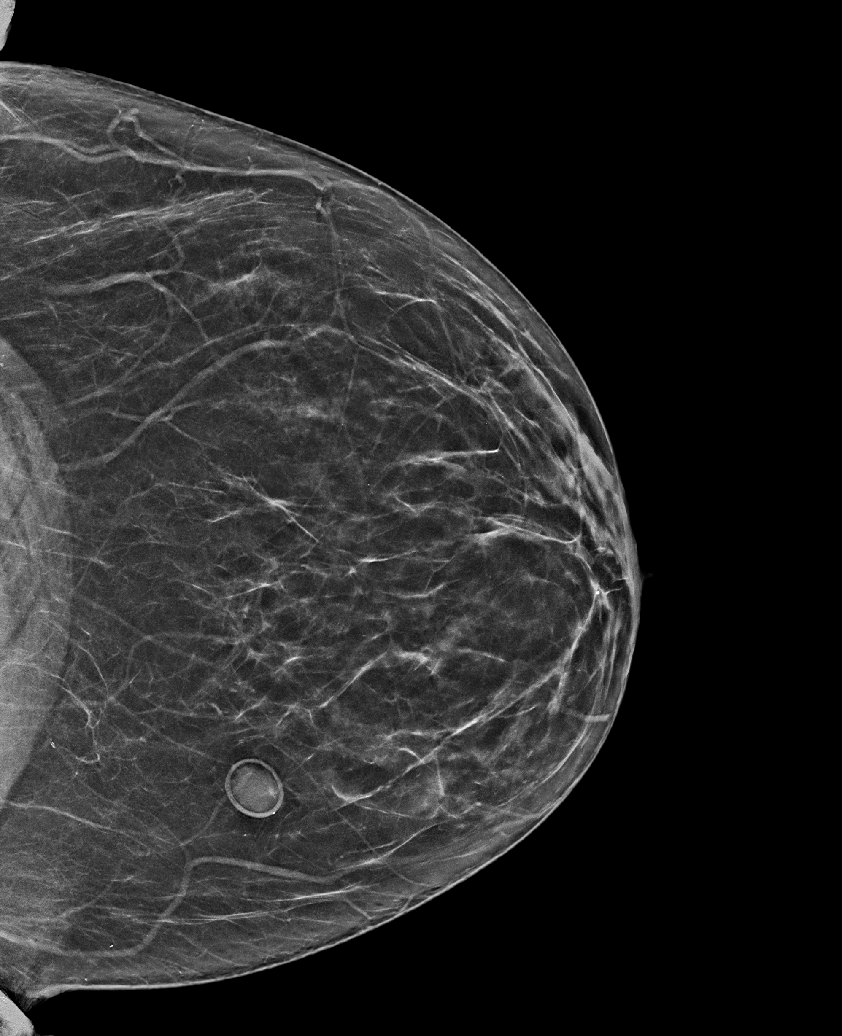

[L MLO synth-2D]
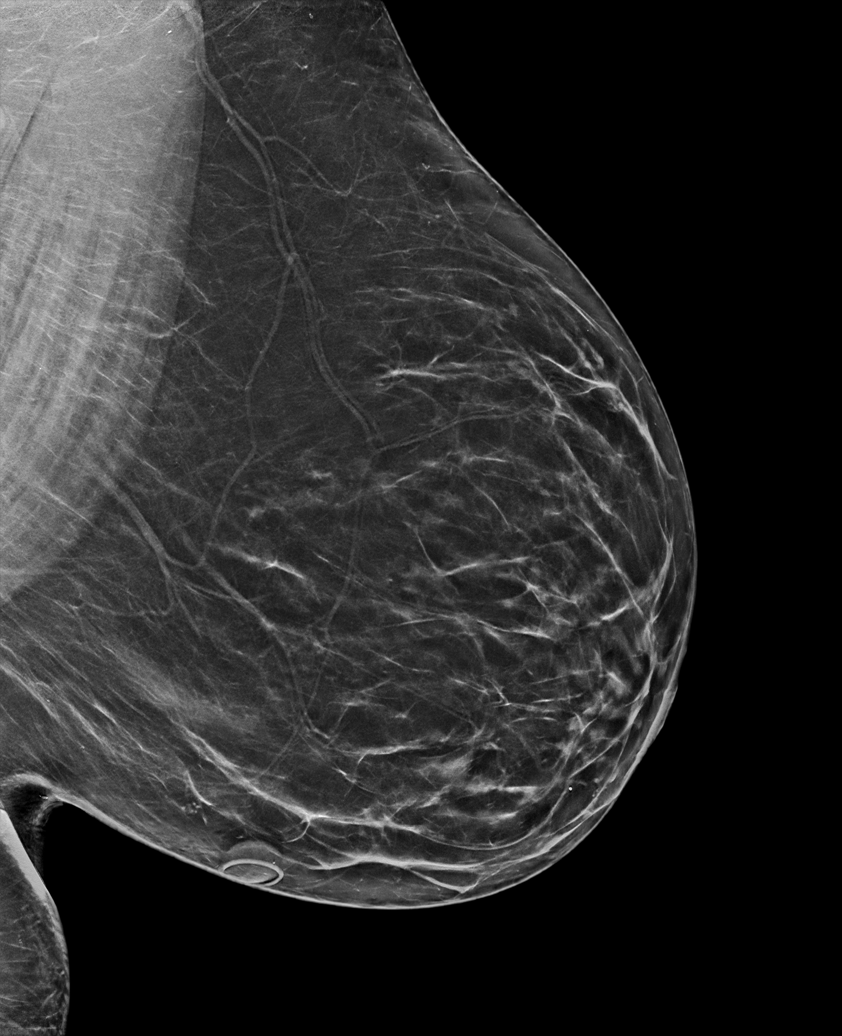

[R CC synth-2D]
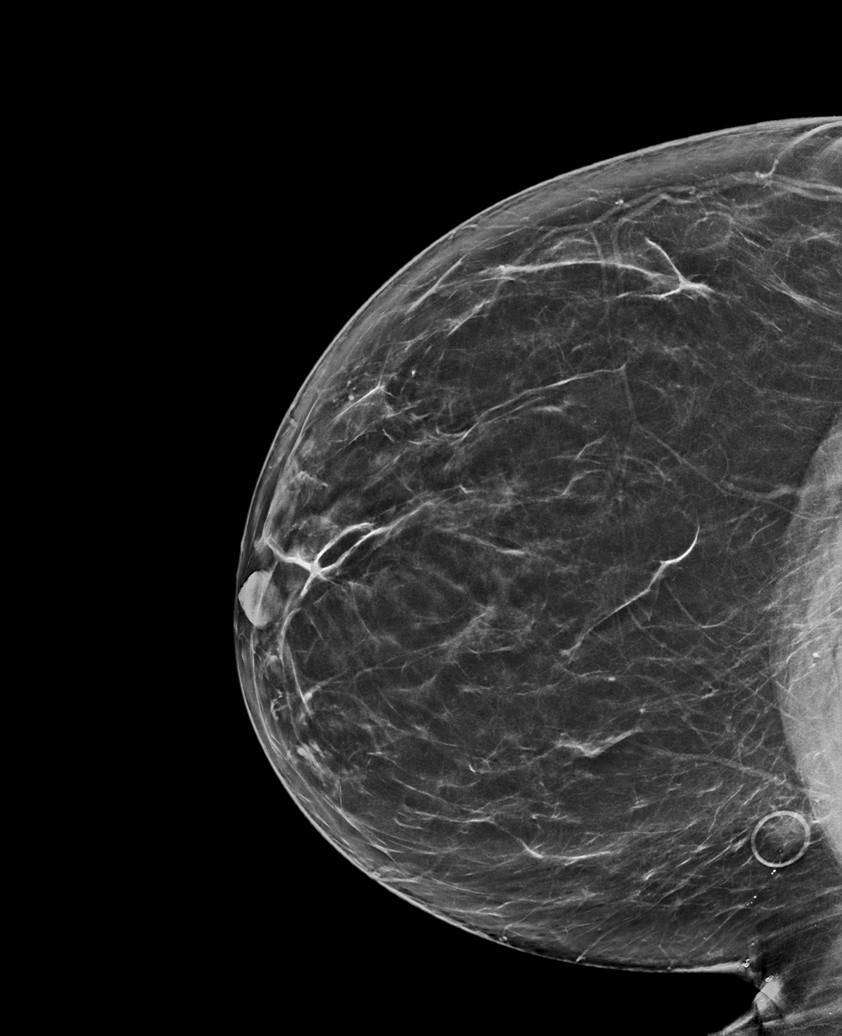

[R CC tomo · tomo slice 39/76.0]
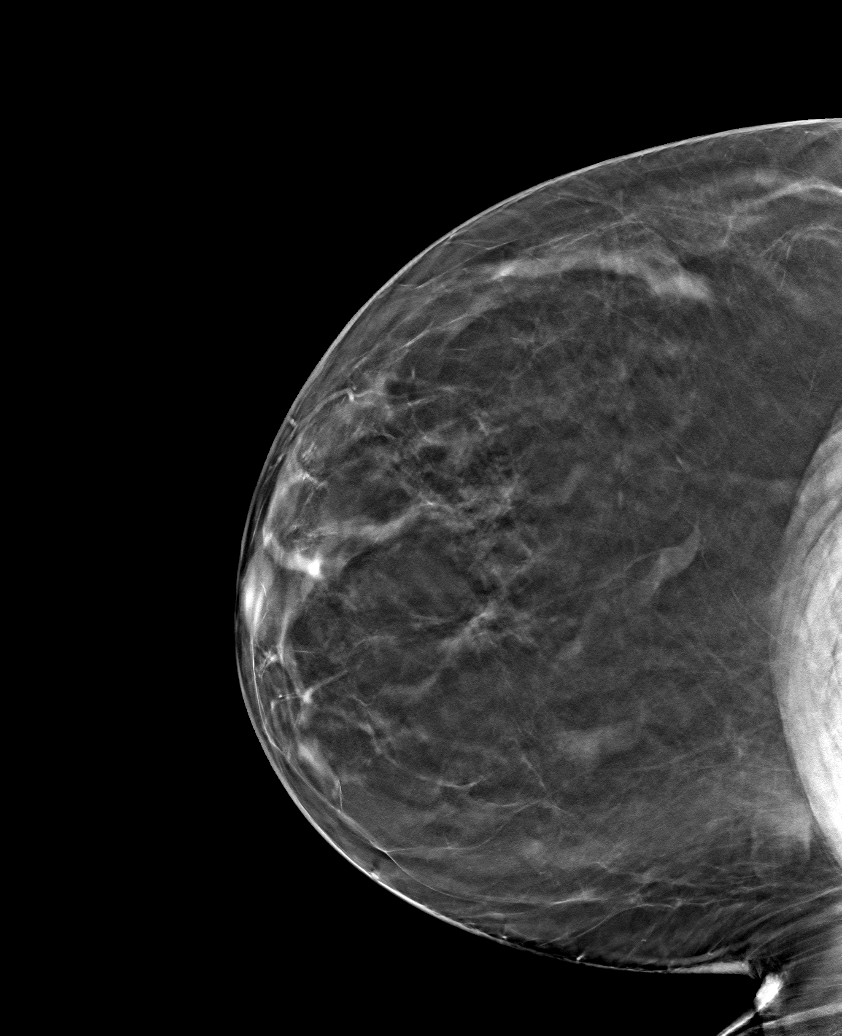

[6 of 30 positions shown; findings below may reference images not displayed]

ACR Breast Density Category b: There are scattered areas of
fibroglandular density.
FINDINGS: There are no findings suspicious for malignancy.
IMPRESSION: No mammographic evidence of malignancy. A result letter of this
screening mammogram will be mailed directly to the patient.

RECOMMENDATION:
Screening mammogram in one year. (Code:XG-X-X7B)

BI-RADS CATEGORY  1: Negative.

## 2022-04-26 ENCOUNTER — Other Ambulatory Visit: Payer: Self-pay | Admitting: Obstetrics and Gynecology

## 2022-04-26 DIAGNOSIS — Z1231 Encounter for screening mammogram for malignant neoplasm of breast: Secondary | ICD-10-CM

## 2022-05-20 ENCOUNTER — Ambulatory Visit
Admission: RE | Admit: 2022-05-20 | Discharge: 2022-05-20 | Disposition: A | Discharge status: Home / Self Care | Payer: BC Managed Care – PPO | Source: Ambulatory Visit | Attending: Obstetrics and Gynecology | Admitting: Obstetrics and Gynecology

## 2022-05-20 DIAGNOSIS — Z1231 Encounter for screening mammogram for malignant neoplasm of breast: Secondary | ICD-10-CM | POA: Diagnosis not present

## 2022-08-25 ENCOUNTER — Telehealth: Payer: Self-pay | Admitting: Cardiology

## 2022-08-25 NOTE — Telephone Encounter (Signed)
She feels light-headed and dizzy often- so much so that she can only drive for about 20 minutes at a time.   No sob, has not passed out, no chest pain, no sweating. She does report having some nausea. I asked her about blood sugar, if it is possible that her blood sugar is going down- she says she eats regularly. She reports that she stays well hydrated.   Made an appt with an APP for 09/02/22. Given ER precautions. She verbalized understanding.

## 2022-08-25 NOTE — Telephone Encounter (Signed)
Agree with bringing in for appointment

## 2022-08-25 NOTE — Telephone Encounter (Signed)
  Per MyChart Scheduling message:  Patient c/o Palpitations:  High priority if patient c/o lightheadedness, shortness of breath, or chest pain  How long have you had palpitations/irregular HR/ Afib? Are you having the symptoms now?   Are you currently experiencing lightheadedness, SOB or CP?   Do you have a history of afib (atrial fibrillation) or irregular heart rhythm?   Have you checked your BP or HR? (document readings if available):   Are you experiencing any other symptoms?    1. How long have you had palpitations/irregular heart rate/ Afib? Are you having the symptoms now?    I have been experiencing these symptoms since  Saturday, May 11. I am currently having the same symptoms.   2. Are you currently experiencing lightheadedness, shortness of breath or chest pain?  I am presently experiencing lightheadedness.    3. Do you have a history of afib (atrial fibrillation) or irregular heart rhythm?  I have experienced tachycardia in the past.   4. Have you checked your blood pressure or heart rate? (document readings if available):   I just checked, and it read 129/78 with a heart rate of 92. It said possible cardiac arrhythmia was found.   5. Are you experiencing any other symptoms?  I have nausea, pressure on the top and back of my head and the base of my neck, and difficulty digesting food. It is difficult to drive for more than 25 minutes. I become so dizzy that I have to pull over and rest a while.

## 2022-08-26 ENCOUNTER — Telehealth: Payer: Self-pay | Admitting: Cardiology

## 2022-08-26 NOTE — Telephone Encounter (Signed)
Patient states she was returning our call but she has an appointment for next week and does not need further assistance today.

## 2022-08-26 NOTE — Telephone Encounter (Signed)
Patient is returning phone call.  °

## 2022-08-26 NOTE — Telephone Encounter (Signed)
Left voicemail to return call to office.

## 2022-08-26 NOTE — Telephone Encounter (Signed)
Patient is returning LPN's call. Please advise. 

## 2022-09-01 NOTE — Progress Notes (Signed)
Cardiology Clinic Note   Date: 09/02/2022 ID: Stephanie Lane, DOB [id number], MRN [id number]  Primary Cardiologist:  None  Patient Profile    Stephanie Lane is a 42 y.o. female who presents to the clinic today for evaluation of episodic dizziness.   Past medical history significant for: Palpitations. 3-day ZIO 05/12/2020: 1 run of SVT lasting 11 beats.  No significant abnormalities. Echo 05/12/2020: EF 60 to 65%.  Normal RV function.  No significant valve abnormalities. Hypothyroidism. Family history of early CAD. Hyperlipidemia. CT cardiac scoring 04/24/2020: Calcium score of 0.   History of Present Illness    Stephanie Lane was first evaluated by Dr. Bjorn Pippin on 04/17/2020 for cardiovascular risk assessment at the request of Stephanie Amble, PA-C.  She complained of episodic lightheadedness and palpitations.  ZIO monitor showed 1 run of SVT and no other significant abnormalities.  Echo showed normal LV/RV function.  Continues to be followed for the above outlined history.  Patient was last seen in the office by Dr. Bjorn Pippin on 10/05/2021 for follow-up.  She continues to report palpitations with heart rate up to 120 bpm.  2-week ZIO was ordered but it does not appear to have been completed.  She also reported snoring and was referred for sleep study which did not show significant OSA.  On 08/25/2022 patient contacted the office with complaints of lightheadedness and dizziness.  Per triage nurse: "She feels light-headed and dizzy often- so much so that she can only drive for about 20 minutes at a time.  No sob, has not passed out, no chest pain, no sweating. She does report having some nausea. I asked her about blood sugar, if it is possible that her blood sugar is going down- she says she eats regularly. She reports that she stays well hydrated."  Today, patient states she had symptoms as above from 08/20/2022 to 08/29/2022. She states lightheadedness/unsteadiness progressed to  dizziness while driving. She feels driving kicked off her symptoms and they would wax and wane throughout the day until she went to sleep. Dizziness described as feeling "like I am on a ship." She reports one episode of palpitations on 08/25/2022 that lasted approximately 30 minutes that felt like fluttering in her chest with associated pressure on the top and back of her head "Like I was going to explode." She reported one episode of feeling like she was not getting enough air on 08/20/2022 when symptoms started but none since. She feels these episodes were related to stress as she was anticipating a situation that was occurring on 5/20 and her symptoms were similar to past experiences with anxiety and panic attacks. She states since the 20th she may have a little bit of a dizzy sensation but she feels it is getting better. She does see a therapist for anxiety and does acupuncture. Discussed previous palpitations. She did not do the 2 week Zio ordered last June because she felt her palpitations were secondary to stress and she was concerned about the cost.     ROS: All other systems reviewed and are otherwise negative except as noted in History of Present Illness.  Studies Reviewed    ECG personally reviewed by me today: NSR, 70 bpm.  No significant changes from 10/05/2021.       Physical Exam    VS:  BP 133/87   Pulse 70   Ht 5\' 1"  (1.549 m)   Wt 249 lb 12.8 oz (113.3 kg)   SpO2 99%   BMI 47.20 kg/m  ,  BMI Body mass index is 47.2 kg/m.  GEN: Well nourished, well developed, in no acute distress. Neck: No JVD or carotid bruits. Cardiac:  RRR. No murmurs. No rubs or gallops.   Respiratory:  Respirations regular and unlabored. Clear to auscultation without rales, wheezing or rhonchi. GI: Soft, nontender, nondistended. Extremities: Radials/DP/PT 2+ and equal bilaterally. No clubbing or cyanosis. No edema.  Skin: Warm and dry, no rash. Neuro: Strength intact.  Assessment & Plan     Lightheadedness/dizziness. Symptoms have been improving since 08/29/2022. Patient attributes symptoms to anxiety and stress. Discussed drawing labs today. She would like to defer lab work to her endocrinologist who she is scheduled to see in June. No further workup is clinically necessary at this time.  Palpitations.  3-day ZIO February 2022 showed 1 run of SVT and no other significant abnormalities.  Echo February 2022 showed normal LV/RV function.  Patient reports one significant episode of palpitations described as flutter in chest with associated pressure in top and back of head lasting 30 minutes. Palpitations were similar to past episodes. EKG shows NSR, 70 bpm. I do not see any real utility in ordering Zio at this point, as patient is not having frequent or prolonged episodes of palpitations. Patient is in agreement. She is instructed to contact the office if she has increased palpitations.   Disposition: Return in 1 year or sooner as needed.          Signed, Etta Grandchild. Morgin Halls, DNP, NP-C

## 2022-09-02 ENCOUNTER — Ambulatory Visit: Payer: BC Managed Care – PPO | Attending: Student | Admitting: Student

## 2022-09-02 ENCOUNTER — Encounter: Payer: Self-pay | Admitting: Student

## 2022-09-02 VITALS — BP 133/87 | HR 70 | Ht 61.0 in | Wt 249.8 lb

## 2022-09-02 DIAGNOSIS — R002 Palpitations: Secondary | ICD-10-CM | POA: Diagnosis not present

## 2022-09-02 DIAGNOSIS — R42 Dizziness and giddiness: Secondary | ICD-10-CM | POA: Diagnosis not present

## 2022-09-02 NOTE — Patient Instructions (Signed)
Medication Instructions:  Your physician recommends that you continue on your current medications as directed. Please refer to the Current Medication list given to you today.  *If you need a refill on your cardiac medications before your next appointment, please call your pharmacy*   Lab Work: NONE If you have labs (blood work) drawn today and your tests are completely normal, you will receive your results only by: MyChart Message (if you have MyChart) OR A paper copy in the mail If you have any lab test that is abnormal or we need to change your treatment, we will call you to review the results.   Testing/Procedures: NONE   Follow-Up: At [facility], you and your health needs are our priority.  As part of our continuing mission to provide you with exceptional heart care, we have created designated Provider Care Teams.  These Care Teams include your primary Cardiologist (physician) and Advanced Practice Providers (APPs -  Physician Assistants and Nurse Practitioners) who all work together to provide you with the care you need, when you need it.  We recommend signing up for the patient portal called "MyChart".  Sign up information is provided on this After Visit Summary.  MyChart is used to connect with patients for Virtual Visits (Telemedicine).  Patients are able to view lab/test results, encounter notes, upcoming appointments, etc.  Non-urgent messages can be sent to your provider as well.   To learn more about what you can do with MyChart, go to [email or web address].    Your next appointment:   1 year(s)  Provider:   Christopher L Schumann, MD    

## 2023-04-24 ENCOUNTER — Other Ambulatory Visit: Payer: Self-pay | Admitting: Obstetrics and Gynecology

## 2023-04-24 DIAGNOSIS — Z1231 Encounter for screening mammogram for malignant neoplasm of breast: Secondary | ICD-10-CM

## 2023-05-23 ENCOUNTER — Ambulatory Visit
Admission: RE | Admit: 2023-05-23 | Discharge: 2023-05-23 | Disposition: A | Discharge status: Home / Self Care | Payer: 59 | Source: Ambulatory Visit | Attending: Obstetrics and Gynecology | Admitting: Obstetrics and Gynecology

## 2023-05-23 DIAGNOSIS — Z1231 Encounter for screening mammogram for malignant neoplasm of breast: Secondary | ICD-10-CM | POA: Diagnosis present

## 2024-04-29 ENCOUNTER — Other Ambulatory Visit: Payer: Self-pay | Admitting: Obstetrics and Gynecology

## 2024-04-29 DIAGNOSIS — Z1231 Encounter for screening mammogram for malignant neoplasm of breast: Secondary | ICD-10-CM

## 2024-05-24 ENCOUNTER — Encounter
